# Patient Record
Sex: Female | Born: 1980 | Hispanic: No | Marital: Married | Smoking: Never smoker
Health system: Southern US, Community
[De-identification: ages and names within clinical notes are randomized; demographics above are authoritative.]

## PROBLEM LIST (undated history)

## (undated) DIAGNOSIS — T7840XA Allergy, unspecified, initial encounter: Secondary | ICD-10-CM

## (undated) DIAGNOSIS — K900 Celiac disease: Secondary | ICD-10-CM

## (undated) DIAGNOSIS — F32A Depression, unspecified: Secondary | ICD-10-CM

## (undated) DIAGNOSIS — E039 Hypothyroidism, unspecified: Secondary | ICD-10-CM

## (undated) DIAGNOSIS — N921 Excessive and frequent menstruation with irregular cycle: Secondary | ICD-10-CM

## (undated) DIAGNOSIS — D509 Iron deficiency anemia, unspecified: Secondary | ICD-10-CM

## (undated) DIAGNOSIS — K909 Intestinal malabsorption, unspecified: Secondary | ICD-10-CM

## (undated) HISTORY — DX: Intestinal malabsorption, unspecified: K90.9

## (undated) HISTORY — DX: Iron deficiency anemia, unspecified: D50.9

## (undated) HISTORY — DX: Excessive and frequent menstruation with irregular cycle: N92.1

## (undated) HISTORY — DX: Celiac disease: K90.0

## (undated) HISTORY — PX: TONSILLECTOMY: SUR1361

---

## 2009-11-05 ENCOUNTER — Inpatient Hospital Stay (HOSPITAL_COMMUNITY): Admission: AD | Admit: 2009-11-05 | Discharge: 2009-11-08 | Payer: Self-pay | Admitting: Internal Medicine

## 2010-09-01 ENCOUNTER — Encounter
Admission: RE | Admit: 2010-09-01 | Discharge: 2010-09-01 | Payer: Self-pay | Source: Home / Self Care | Attending: Gastroenterology | Admitting: Gastroenterology

## 2010-09-11 ENCOUNTER — Inpatient Hospital Stay (HOSPITAL_COMMUNITY): Payer: 59

## 2010-09-11 ENCOUNTER — Inpatient Hospital Stay (HOSPITAL_COMMUNITY)
Admission: AD | Admit: 2010-09-11 | Discharge: 2010-09-11 | Disposition: A | Discharge status: Home / Self Care | Payer: 59 | Source: Ambulatory Visit | Attending: Obstetrics and Gynecology | Admitting: Obstetrics and Gynecology

## 2010-09-11 ENCOUNTER — Encounter (HOSPITAL_COMMUNITY): Payer: Self-pay

## 2010-09-11 DIAGNOSIS — O00109 Unspecified tubal pregnancy without intrauterine pregnancy: Secondary | ICD-10-CM | POA: Insufficient documentation

## 2010-09-11 DIAGNOSIS — O99891 Other specified diseases and conditions complicating pregnancy: Secondary | ICD-10-CM

## 2010-09-11 DIAGNOSIS — O9989 Other specified diseases and conditions complicating pregnancy, childbirth and the puerperium: Secondary | ICD-10-CM

## 2010-09-11 LAB — URINALYSIS, ROUTINE W REFLEX MICROSCOPIC
Bilirubin Urine: NEGATIVE
Hgb urine dipstick: NEGATIVE
Nitrite: NEGATIVE
Specific Gravity, Urine: 1.025 (ref 1.005–1.030)
Urobilinogen, UA: 0.2 mg/dL (ref 0.0–1.0)
pH: 6 (ref 5.0–8.0)

## 2010-10-21 LAB — PLATELET COUNT: Platelets: 96 10*3/uL — ABNORMAL LOW (ref 150–400)

## 2010-10-21 LAB — CBC
HCT: 30.7 % — ABNORMAL LOW (ref 36.0–46.0)
Hemoglobin: 10.4 g/dL — ABNORMAL LOW (ref 12.0–15.0)
Hemoglobin: 11.3 g/dL — ABNORMAL LOW (ref 12.0–15.0)
RBC: 3.37 MIL/uL — ABNORMAL LOW (ref 3.87–5.11)
RBC: 3.74 MIL/uL — ABNORMAL LOW (ref 3.87–5.11)
WBC: 8.9 10*3/uL (ref 4.0–10.5)

## 2010-10-21 LAB — RPR: RPR Ser Ql: NONREACTIVE

## 2014-06-03 ENCOUNTER — Encounter (HOSPITAL_COMMUNITY): Payer: Self-pay

## 2015-01-22 ENCOUNTER — Other Ambulatory Visit (HOSPITAL_BASED_OUTPATIENT_CLINIC_OR_DEPARTMENT_OTHER): Payer: 59

## 2015-01-22 ENCOUNTER — Encounter: Payer: Self-pay | Admitting: Hematology & Oncology

## 2015-01-22 ENCOUNTER — Ambulatory Visit: Payer: 59

## 2015-01-22 ENCOUNTER — Ambulatory Visit (HOSPITAL_BASED_OUTPATIENT_CLINIC_OR_DEPARTMENT_OTHER): Payer: 59 | Admitting: Hematology & Oncology

## 2015-01-22 VITALS — BP 109/67 | HR 57 | Temp 97.5°F | Resp 14 | Ht 66.0 in | Wt 160.0 lb

## 2015-01-22 DIAGNOSIS — D510 Vitamin B12 deficiency anemia due to intrinsic factor deficiency: Secondary | ICD-10-CM

## 2015-01-22 DIAGNOSIS — D696 Thrombocytopenia, unspecified: Secondary | ICD-10-CM

## 2015-01-22 DIAGNOSIS — E039 Hypothyroidism, unspecified: Secondary | ICD-10-CM

## 2015-01-22 DIAGNOSIS — D500 Iron deficiency anemia secondary to blood loss (chronic): Secondary | ICD-10-CM

## 2015-01-22 DIAGNOSIS — K900 Celiac disease: Secondary | ICD-10-CM | POA: Diagnosis not present

## 2015-01-22 DIAGNOSIS — R5383 Other fatigue: Secondary | ICD-10-CM | POA: Diagnosis not present

## 2015-01-22 LAB — CBC WITH DIFFERENTIAL (CANCER CENTER ONLY)
BASO#: 0 10*3/uL (ref 0.0–0.2)
BASO%: 0.4 % (ref 0.0–2.0)
EOS ABS: 0.3 10*3/uL (ref 0.0–0.5)
EOS%: 5.4 % (ref 0.0–7.0)
HCT: 35.6 % (ref 34.8–46.6)
HEMOGLOBIN: 11.8 g/dL (ref 11.6–15.9)
LYMPH#: 1.2 10*3/uL (ref 0.9–3.3)
LYMPH%: 22.2 % (ref 14.0–48.0)
MCH: 27.6 pg (ref 26.0–34.0)
MCHC: 33.1 g/dL (ref 32.0–36.0)
MCV: 83 fL (ref 81–101)
MONO#: 0.5 10*3/uL (ref 0.1–0.9)
MONO%: 8.1 % (ref 0.0–13.0)
NEUT%: 63.9 % (ref 39.6–80.0)
NEUTROS ABS: 3.5 10*3/uL (ref 1.5–6.5)
Platelets: 165 10*3/uL (ref 145–400)
RBC: 4.28 10*6/uL (ref 3.70–5.32)
RDW: 13.3 % (ref 11.1–15.7)
WBC: 5.5 10*3/uL (ref 3.9–10.0)

## 2015-01-22 LAB — VITAMIN B12: VITAMIN B 12: 605 pg/mL (ref 211–911)

## 2015-01-22 LAB — RETICULOCYTES (CHCC)
ABS Retic: 56.9 10*3/uL (ref 19.0–186.0)
RBC.: 4.38 MIL/uL (ref 3.87–5.11)
Retic Ct Pct: 1.3 % (ref 0.4–2.3)

## 2015-01-22 LAB — CMP (CANCER CENTER ONLY)
ALBUMIN: 3.4 g/dL (ref 3.3–5.5)
ALT: 18 U/L (ref 10–47)
AST: 23 U/L (ref 11–38)
Alkaline Phosphatase: 65 U/L (ref 26–84)
BILIRUBIN TOTAL: 0.7 mg/dL (ref 0.20–1.60)
BUN: 8 mg/dL (ref 7–22)
CALCIUM: 8.7 mg/dL (ref 8.0–10.3)
CO2: 28 mEq/L (ref 18–33)
CREATININE: 0.8 mg/dL (ref 0.6–1.2)
Chloride: 101 mEq/L (ref 98–108)
Glucose, Bld: 98 mg/dL (ref 73–118)
Potassium: 3.8 mEq/L (ref 3.3–4.7)
Sodium: 138 mEq/L (ref 128–145)
Total Protein: 6.9 g/dL (ref 6.4–8.1)

## 2015-01-22 LAB — TECHNOLOGIST REVIEW CHCC SATELLITE

## 2015-01-22 LAB — CHCC SATELLITE - SMEAR

## 2015-01-22 NOTE — Progress Notes (Signed)
Referral MD  Reason for Referral: Thrombocytopenia and fatigue.   Chief Complaint  Patient presents with  . NEW PATIENT  : I'm so tired. I platelets are low.  HPI: Regina Mayo is a very nice 34 year old [name]. She has 2 children. She has a very interesting history in which she has celiac disease. She was diagnosed with this back in 2009.  She has been having more problems lately. 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 She just is felt incredibly tired. She's had a lot of fatigue. She is seeing her family doctor. Lab work was done. She was found have some thrombocytopenia. Go back to the labs, her blood count has been on the low side for a few years.  The patient tells me that she has at iron in the past. She is taking oral B-12. I find this interesting since I would think that this would not be effective with celiac disease.  She's had no bleeding outside of her monthly cycles which might be a little bit heavier.  She's had no surgery. There is no history of blood problems in the family. There is no history of cancer in the family.  She's had no weight loss or weight gain. She's had no fever. She's had no rashes. She's had no mouth sores. She is not chewing ice.  She is coming referred to the [facility] for an evaluation.  Overall, her performance status is ECOG 1.   No past medical history on file.:  No past surgical history on file.:   Current outpatient prescriptions:  .  citalopram (CELEXA) 20 MG tablet, Take 20 mg by mouth daily. , Disp: , Rfl:  .  Fe Fum-FePoly-Vit C-Vit B3 (INTEGRA) 62.5-62.5-40-3 MG CAPS, Take by mouth every morning. , Disp: , Rfl:  .  levothyroxine (SYNTHROID, LEVOTHROID) 88 MCG tablet, 88 mcg daily before breakfast. , Disp: , Rfl:  .  meloxicam (MOBIC) 15 MG tablet, Take 15 mg by mouth as needed. , Disp: , Rfl:  .  Vitamin D, Ergocalciferol, (DRISDOL) 50000 UNITS CAPS capsule, Take 50,000 Units by mouth every 30 (thirty) days. ,  Disp: , Rfl: :  :  No Known Allergies:  No family history on file.:  History   Social History  . Marital Status: Married    Spouse Name: N/A  . Number of Children: N/A  . Years of Education: N/A   Occupational History  . Not on file.   Social History Main Topics  . Smoking status: Never Smoker   . Smokeless tobacco: Never Used     Comment: NEVER USED TOBACCO  . Alcohol Use: Not on file  . Drug Use: Not on file  . Sexual Activity: Not on file   Other Topics Concern  . Not on file   Social History Narrative  :  Pertinent items are noted in HPI.  Exam: @IPVITALS @  well-developed and well-nourished [name] in no obvious distress. Her vital signs show a temperature of 97.5. Pulse 57. Blood pressure 109/67. Weight is 160 pounds. Head and neck exam shows no ocular or oral lesions. She has no scleral icterus. She has no glossitis. There is no adenopathy in her neck. Lungs are clear bilaterally. Cardiac exam regular rate and rhythm with no murmurs, rubs or bruits. Abdomen is soft. She has good bowel sounds. There is no fluid wave. There is no guarding or rebound tenderness. There is no palpable liver or spleen tip. Back exam shows no tenderness over the spine, ribs  or hips. Extremities shows no clubbing, cyanosis or edema. Neurological exam shows no obvious neurological deficits.    Recent Labs  01/22/15 1411  WBC 5.5  HGB 11.8  HCT 35.6  PLT 165    Recent Labs  01/22/15 1412  NA 138  K 3.8  CL 101  CO2 28  GLUCOSE 98  BUN 8  CREATININE 0.8  CALCIUM 8.7    Blood smear review: Slight anisocytosis. She has some microcytic red blood cells. I see no target cells. There is no inclusion bodies. There is no schistocytes or spherocytes. She has no rouleau formation. There are no nucleated red cells. White cells been normal in morphology maturation. I see no hypersegmented polys. I see no atypical lymphocytes. There is no immature myeloid cells. Platelets are normal  in size. Platelets are well granulated.  Pathology: None     Assessment and Plan: She is a 34 year old [name]. She has transient thrombocytopenia. I don't think this is at all related to her fatigue.  I would have to suspect that she has iron deficiency by her blood smear. I think also the fact that her platelet count is now normal would indicate that her iron might be on the low side. Again, her blood smear would be suggestive of iron deficiency.  I don't see any other hematologic issue. I don't see any indication for a bone marrow biopsy. I don't see any indication for x-ray studies.  I spent about 45 minutes with her. I showed her the lab work. I'll try to explain to her that I thought that her iron might be on the low side. If it is low, then she will definitely need IV iron as she has celiac disease and will not Beil to absorb any oral iron. Not sure why she is even on oral iron.  If her iron stores are okay, I'm not sure as to why she would be fatigued. I don't find anything on her exam that would suggest a malignancy.  We will give her a call and let her know the results of our lab work and then figure out when we need to get her back.

## 2015-01-23 LAB — IRON AND TIBC CHCC
%SAT: 14 % — AB (ref 21–57)
IRON: 52 ug/dL (ref 41–142)
TIBC: 371 ug/dL (ref 236–444)
UIBC: 319 ug/dL (ref 120–384)

## 2015-01-23 LAB — FERRITIN CHCC: FERRITIN: 7 ng/mL — AB (ref 9–269)

## 2015-01-23 LAB — ANTI-PARIETAL ANTIBODY: Parietal Cell Antibody-IgG: POSITIVE — AB

## 2015-01-25 LAB — INTRINSIC FACTOR ANTIBODIES: INTRINSIC FACTOR: NEGATIVE

## 2015-01-28 ENCOUNTER — Telehealth: Payer: Self-pay | Admitting: *Deleted

## 2015-01-28 NOTE — Telephone Encounter (Addendum)
Message left on personal voice mail. Message sent to scheduler.   ----- Message from Volanda Napoleon, MD sent at 01/26/2015  9:21 PM EDT ----- Call - iron levels are very low!!!  Need 2 doses of feraheme!!  Please set up!!  Make sure I set an appt up with her in 5 weeks!!! Laurey Arrow

## 2015-02-06 ENCOUNTER — Other Ambulatory Visit: Payer: Self-pay | Admitting: Family

## 2015-02-06 ENCOUNTER — Ambulatory Visit (HOSPITAL_BASED_OUTPATIENT_CLINIC_OR_DEPARTMENT_OTHER): Payer: 59

## 2015-02-06 DIAGNOSIS — D509 Iron deficiency anemia, unspecified: Secondary | ICD-10-CM

## 2015-02-06 DIAGNOSIS — E611 Iron deficiency: Secondary | ICD-10-CM

## 2015-02-06 MED ORDER — SODIUM CHLORIDE 0.9 % IV SOLN
510.0000 mg | Freq: Once | INTRAVENOUS | Status: DC
  Administered 2015-02-06: 510 mg via INTRAVENOUS
  Filled 2015-02-06: qty 17

## 2015-02-06 NOTE — Patient Instructions (Signed)

## 2015-02-13 ENCOUNTER — Ambulatory Visit (HOSPITAL_BASED_OUTPATIENT_CLINIC_OR_DEPARTMENT_OTHER): Payer: 59

## 2015-02-13 VITALS — BP 100/54 | HR 71 | Temp 97.8°F | Resp 20

## 2015-02-13 DIAGNOSIS — D509 Iron deficiency anemia, unspecified: Secondary | ICD-10-CM

## 2015-02-13 MED ORDER — SODIUM CHLORIDE 0.9 % IV SOLN
INTRAVENOUS | Status: DC
  Administered 2015-02-13: 14:00:00 via INTRAVENOUS

## 2015-02-13 MED ORDER — SODIUM CHLORIDE 0.9 % IV SOLN
510.0000 mg | Freq: Once | INTRAVENOUS | Status: AC
  Administered 2015-02-13: 510 mg via INTRAVENOUS
  Filled 2015-02-13: qty 17

## 2015-02-13 NOTE — Patient Instructions (Signed)

## 2015-03-03 ENCOUNTER — Other Ambulatory Visit: Payer: Self-pay | Admitting: *Deleted

## 2015-03-03 DIAGNOSIS — D509 Iron deficiency anemia, unspecified: Secondary | ICD-10-CM

## 2015-03-04 ENCOUNTER — Encounter: Payer: Self-pay | Admitting: Hematology & Oncology

## 2015-03-04 ENCOUNTER — Ambulatory Visit (HOSPITAL_BASED_OUTPATIENT_CLINIC_OR_DEPARTMENT_OTHER): Payer: 59 | Admitting: Family

## 2015-03-04 ENCOUNTER — Other Ambulatory Visit (HOSPITAL_BASED_OUTPATIENT_CLINIC_OR_DEPARTMENT_OTHER): Payer: 59

## 2015-03-04 VITALS — BP 102/62 | HR 69 | Temp 98.0°F | Resp 18 | Ht 66.0 in | Wt 163.0 lb

## 2015-03-04 DIAGNOSIS — D509 Iron deficiency anemia, unspecified: Secondary | ICD-10-CM

## 2015-03-04 DIAGNOSIS — D696 Thrombocytopenia, unspecified: Secondary | ICD-10-CM | POA: Diagnosis not present

## 2015-03-04 LAB — IRON AND TIBC CHCC
%SAT: 52 % (ref 21–57)
Iron: 128 ug/dL (ref 41–142)
TIBC: 249 ug/dL (ref 236–444)
UIBC: 120 ug/dL (ref 120–384)

## 2015-03-04 LAB — CBC WITH DIFFERENTIAL (CANCER CENTER ONLY)
BASO#: 0 10*3/uL (ref 0.0–0.2)
BASO%: 0.7 % (ref 0.0–2.0)
EOS%: 3.5 % (ref 0.0–7.0)
Eosinophils Absolute: 0.2 10*3/uL (ref 0.0–0.5)
HCT: 38.2 % (ref 34.8–46.6)
HGB: 12.7 g/dL (ref 11.6–15.9)
LYMPH#: 0.8 10*3/uL — ABNORMAL LOW (ref 0.9–3.3)
LYMPH%: 18.4 % (ref 14.0–48.0)
MCH: 28.4 pg (ref 26.0–34.0)
MCHC: 33.2 g/dL (ref 32.0–36.0)
MCV: 86 fL (ref 81–101)
MONO#: 0.4 10*3/uL (ref 0.1–0.9)
MONO%: 9.2 % (ref 0.0–13.0)
NEUT%: 68.2 % (ref 39.6–80.0)
NEUTROS ABS: 2.9 10*3/uL (ref 1.5–6.5)
PLATELETS: 118 10*3/uL — AB (ref 145–400)
RBC: 4.47 10*6/uL (ref 3.70–5.32)
RDW: 14.9 % (ref 11.1–15.7)
WBC: 4.3 10*3/uL (ref 3.9–10.0)

## 2015-03-04 LAB — FERRITIN CHCC: FERRITIN: 240 ng/mL (ref 9–269)

## 2015-03-04 NOTE — Progress Notes (Signed)
Hematology and Oncology Follow Up Visit  Regina Mayo [id number] 1981/05/19 34 y.o. 03/04/2015   Principle Diagnosis:  Iron deficiency anemia   Current Therapy:   IV iron as indicated     Interim History:  Regina Mayo is here today for a follow-up. She received 2 doses of Feraheme in July and has responded quite nicely. Her symptoms are much improved. Her SOB and fatigue are much better.  She described her last cycle as "more normal" and not as heavy as usual.  She has had no fever, chills, n/v, cough, rash, dizziness, headaches, oral sores, chest pain, palpitations, abdominal pain, no changes in bowel or bladder habits. No blood in her urine or stool.  She has had no bruising, petechiae or episodes of bleeding.  No swelling, tenderess, numbness or tingling in her extremities. No c/o pain.  She has a good appetite and is eating well. She stays on a strict diet due to celiac disease.   Medications:    Medication List       This list is accurate as of: 03/04/15 12:26 PM.  Always use your most recent med list.               INTEGRA 62.5-62.5-40-3 MG Caps  Take by mouth every morning.     levothyroxine 88 MCG tablet  Commonly known as:  SYNTHROID, LEVOTHROID  88 mcg daily before breakfast.     PRISTIQ PO  Take 1 tablet by mouth daily.     Vitamin D (Ergocalciferol) 50000 UNITS Caps capsule  Commonly known as:  DRISDOL  Take 50,000 Units by mouth every 30 (thirty) days.        Allergies: No Known Allergies  Past Medical History, Surgical history, Social history, and Family History were reviewed and updated.  Review of Systems: All other 10 point review of systems is negative.   Physical Exam:  height is 5' 6"  (1.676 m) and weight is 163 lb (73.936 kg). Her oral temperature is 98 F (36.7 C). Her blood pressure is 102/62 and her pulse is 69. Her respiration is 18.   Wt Readings from Last 3 Encounters:  03/04/15 163 lb (73.936 kg)  01/22/15 160 lb (72.576  kg)    Ocular: Sclerae unicteric, pupils equal, round and reactive to light Ear-nose-throat: Oropharynx clear, dentition fair Lymphatic: No cervical or supraclavicular adenopathy Lungs no rales or rhonchi, good excursion bilaterally Heart regular rate and rhythm, no murmur appreciated Abd soft, nontender, positive bowel sounds MSK no focal spinal tenderness, no joint edema Neuro: non-focal, well-oriented, appropriate affect Breasts: Deferred  Lab Results  Component Value Date   WBC 4.3 03/04/2015   HGB 12.7 03/04/2015   HCT 38.2 03/04/2015   MCV 86 03/04/2015   PLT 118* 03/04/2015   Lab Results  Component Value Date   FERRITIN 7* 01/22/2015   IRON 52 01/22/2015   TIBC 371 01/22/2015   UIBC 319 01/22/2015   IRONPCTSAT 14* 01/22/2015   Lab Results  Component Value Date   RETICCTPCT 1.3 01/22/2015   RBC 4.47 03/04/2015   RETICCTABS 56.9 01/22/2015   No results found for: KPAFRELGTCHN, LAMBDASER, KAPLAMBRATIO No results found for: IGGSERUM, IGA, IGMSERUM No results found for: Ronnald Ramp, A1GS, A2GS, Tillman Sers, SPEI   Chemistry      Component Value Date/Time   NA 138 01/22/2015 1412   K 3.8 01/22/2015 1412   CL 101 01/22/2015 1412   CO2 28 01/22/2015 1412   BUN 8 01/22/2015 1412  CREATININE 0.8 01/22/2015 1412      Component Value Date/Time   CALCIUM 8.7 01/22/2015 1412   ALKPHOS 65 01/22/2015 1412   AST 23 01/22/2015 1412   ALT 18 01/22/2015 1412   BILITOT 0.70 01/22/2015 1412     Impression and Plan: She is a 34 year old [name] with iron deficiency anemia and thrombocytopenia. She has received 2 doses of Feraheme and is beginning to feel much better.  Her Hgb and MCV are both improved. We will see what her iron studies show. We can bring her in later this week for another dose of iron.  We will plan to see her back in 3 months for labs and follow-up.  She knows to contact us with any questions or concerns. We can  certainly see her sooner if need be.   Eliezer Bottom, NP 8/2/201612:26 PM

## 2015-03-06 ENCOUNTER — Telehealth: Payer: Self-pay | Admitting: *Deleted

## 2015-03-06 NOTE — Telephone Encounter (Signed)
-----   Message from Volanda Napoleon, MD sent at 03/05/2015  2:23 PM EDT ----- Call - iron level is much better!!!  pete

## 2015-06-04 ENCOUNTER — Ambulatory Visit: Payer: 59 | Admitting: Hematology & Oncology

## 2015-06-04 ENCOUNTER — Other Ambulatory Visit: Payer: 59

## 2015-06-04 ENCOUNTER — Ambulatory Visit: Payer: 59

## 2015-07-17 ENCOUNTER — Ambulatory Visit: Payer: 59 | Admitting: Hematology & Oncology

## 2015-07-17 ENCOUNTER — Other Ambulatory Visit: Payer: 59

## 2015-07-17 ENCOUNTER — Ambulatory Visit: Payer: 59

## 2015-08-08 ENCOUNTER — Encounter: Payer: Self-pay | Admitting: Hematology & Oncology

## 2015-08-08 ENCOUNTER — Ambulatory Visit (HOSPITAL_BASED_OUTPATIENT_CLINIC_OR_DEPARTMENT_OTHER): Payer: BLUE CROSS/BLUE SHIELD

## 2015-08-08 ENCOUNTER — Other Ambulatory Visit (HOSPITAL_BASED_OUTPATIENT_CLINIC_OR_DEPARTMENT_OTHER): Payer: BLUE CROSS/BLUE SHIELD

## 2015-08-08 ENCOUNTER — Ambulatory Visit (HOSPITAL_BASED_OUTPATIENT_CLINIC_OR_DEPARTMENT_OTHER): Payer: BLUE CROSS/BLUE SHIELD | Admitting: Hematology & Oncology

## 2015-08-08 VITALS — BP 114/72 | HR 82 | Temp 98.2°F | Resp 16 | Ht 66.0 in | Wt 164.0 lb

## 2015-08-08 DIAGNOSIS — K900 Celiac disease: Secondary | ICD-10-CM

## 2015-08-08 DIAGNOSIS — K909 Intestinal malabsorption, unspecified: Secondary | ICD-10-CM

## 2015-08-08 DIAGNOSIS — D509 Iron deficiency anemia, unspecified: Secondary | ICD-10-CM | POA: Diagnosis not present

## 2015-08-08 DIAGNOSIS — E559 Vitamin D deficiency, unspecified: Secondary | ICD-10-CM

## 2015-08-08 DIAGNOSIS — N921 Excessive and frequent menstruation with irregular cycle: Secondary | ICD-10-CM | POA: Insufficient documentation

## 2015-08-08 HISTORY — DX: Iron deficiency anemia, unspecified: D50.9

## 2015-08-08 HISTORY — DX: Excessive and frequent menstruation with irregular cycle: N92.1

## 2015-08-08 HISTORY — DX: Intestinal malabsorption, unspecified: K90.9

## 2015-08-08 HISTORY — DX: Celiac disease: K90.0

## 2015-08-08 LAB — CBC WITH DIFFERENTIAL (CANCER CENTER ONLY)
BASO#: 0.1 10*3/uL (ref 0.0–0.2)
BASO%: 1 % (ref 0.0–2.0)
EOS%: 3.9 % (ref 0.0–7.0)
Eosinophils Absolute: 0.2 10*3/uL (ref 0.0–0.5)
HEMATOCRIT: 39.4 % (ref 34.8–46.6)
HGB: 12.5 g/dL (ref 11.6–15.9)
LYMPH#: 1 10*3/uL (ref 0.9–3.3)
LYMPH%: 19.4 % (ref 14.0–48.0)
MCH: 27.7 pg (ref 26.0–34.0)
MCHC: 31.7 g/dL — ABNORMAL LOW (ref 32.0–36.0)
MCV: 87 fL (ref 81–101)
MONO#: 0.5 10*3/uL (ref 0.1–0.9)
MONO%: 8.8 % (ref 0.0–13.0)
NEUT#: 3.4 10*3/uL (ref 1.5–6.5)
NEUT%: 66.9 % (ref 39.6–80.0)
Platelets: 171 10*3/uL (ref 145–400)
RBC: 4.52 10*6/uL (ref 3.70–5.32)
RDW: 12.8 % (ref 11.1–15.7)
WBC: 5.1 10*3/uL (ref 3.9–10.0)

## 2015-08-08 LAB — COMPREHENSIVE METABOLIC PANEL
ALT: 15 U/L (ref 0–55)
AST: 21 U/L (ref 5–34)
Albumin: 3.9 g/dL (ref 3.5–5.0)
Alkaline Phosphatase: 78 U/L (ref 40–150)
Anion Gap: 8 mEq/L (ref 3–11)
BUN: 17.2 mg/dL (ref 7.0–26.0)
CHLORIDE: 104 meq/L (ref 98–109)
CO2: 26 mEq/L (ref 22–29)
Calcium: 9.1 mg/dL (ref 8.4–10.4)
Creatinine: 0.8 mg/dL (ref 0.6–1.1)
EGFR: >90 mL/min/{1.73_m2} (ref >90)
Glucose: 99 mg/dl (ref 70–140)
POTASSIUM: 4.1 meq/L (ref 3.5–5.1)
Sodium: 138 mEq/L (ref 136–145)
Total Bilirubin: <0.3 mg/dL (ref 0.20–1.20)
Total Protein: 7.6 g/dL (ref 6.4–8.3)

## 2015-08-08 LAB — RETICULOCYTES
ABS Retic: 58.1 10*3/uL (ref 19.0–186.0)
RBC.: 4.47 MIL/uL (ref 3.87–5.11)
Retic Ct Pct: 1.3 % (ref 0.4–2.3)

## 2015-08-08 MED ORDER — SODIUM CHLORIDE 0.9 % IV SOLN
Freq: Once | INTRAVENOUS | Status: AC
  Administered 2015-08-08: 14:00:00 via INTRAVENOUS

## 2015-08-08 MED ORDER — SODIUM CHLORIDE 0.9 % IV SOLN
510.0000 mg | Freq: Once | INTRAVENOUS | Status: AC
  Administered 2015-08-08: 510 mg via INTRAVENOUS
  Filled 2015-08-08: qty 17

## 2015-08-08 NOTE — Progress Notes (Signed)
Hematology and Oncology Follow Up Visit  Regina Mayo [id number] 04/13/1981 35 y.o. 08/08/2015   Principle Diagnosis:  Iron deficiency anemia   Current Therapy:   IV iron as indicated - dose given 08/08/2015    Interim History:  Regina Mayo is here today for a follow-up. She last received iron back in July. This made her feel a lot better. She feels tired now. She feels more fatigued. She still having her monthly cycles. She loses iron with her monthly cycles.  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 She does have some celiac disease.   She's had some soreness in her mouth. She's had some slight inflammation of her tongue.  She's not chewing ice.  She's had no cough.  She does have some arthralgias. I suspect she may also have some vitamin D deficiency.  She's had no fever.  Outside of the month is cycles, she's had no bleeding.  She and her family might be going back to [place] for vacation this summer.    Medications:    Medication List       This list is accurate as of: 08/08/15  1:26 PM.  Always use your most recent med list.               levothyroxine 88 MCG tablet  Commonly known as:  SYNTHROID, LEVOTHROID  88 mcg daily before breakfast.     PRISTIQ PO  Take 1 tablet by mouth daily.     PRISTIQ 50 MG 24 hr tablet  Generic drug:  desvenlafaxine  Take 50 tablets by mouth daily.     Vitamin D (Ergocalciferol) 50000 units Caps capsule  Commonly known as:  DRISDOL  Take 50,000 Units by mouth every 30 (thirty) days.        Allergies: No Known Allergies  Past Medical History, Surgical history, Social history, and Family History were reviewed and updated.  Review of Systems: All other 10 point review of systems is negative.   Physical Exam:  height is 5' 6"  (1.676 m) and weight is 164 lb (74.39 kg). Her oral temperature is 98.2 F (36.8 C). Her blood pressure is 114/72 and her pulse is 82. Her respiration is 16.   Wt Readings from Last 3  Encounters:  08/08/15 164 lb (74.39 kg)  03/04/15 163 lb (73.936 kg)  01/22/15 160 lb (72.576 kg)    Head exam shows no ocular or oral lesions. She has no scleral icterus. There is no conjunctival pallor. She's had no palpable lymph glands. She does have some slight tongue inflammation. Lungs are clear. Cardiac exam regular rate and rhythm with no murmurs, rubs or bruits. Abdomen is soft and she has good bowel sounds. There is no fluid wave. There is no palpable liver or spleen tip. Back exam shows no tenderness over the spine, ribs or hips. Extrema shows no clubbing, cyanosis or edema. Neurological exam shows no focal neurological deficits.  Lab Results  Component Value Date   WBC 5.1 08/08/2015   HGB 12.5 08/08/2015   HCT 39.4 08/08/2015   MCV 87 08/08/2015   PLT 171 08/08/2015   Lab Results  Component Value Date   FERRITIN 240 03/04/2015   IRON 128 03/04/2015   TIBC 249 03/04/2015   UIBC 120 03/04/2015   IRONPCTSAT 52 03/04/2015   Lab Results  Component Value Date   RETICCTPCT 1.3 01/22/2015   RBC 4.52 08/08/2015   RETICCTABS 56.9 01/22/2015   No results found for: KPAFRELGTCHN, LAMBDASER, KAPLAMBRATIO No results found for:  IGGSERUM, IGA, IGMSERUM No results found for: Regina Mayo, SPEI   Chemistry      Component Value Date/Time   NA 138 01/22/2015 1412   K 3.8 01/22/2015 1412   CL 101 01/22/2015 1412   CO2 28 01/22/2015 1412   BUN 8 01/22/2015 1412   CREATININE 0.8 01/22/2015 1412      Component Value Date/Time   CALCIUM 8.7 01/22/2015 1412   ALKPHOS 65 01/22/2015 1412   AST 23 01/22/2015 1412   ALT 18 01/22/2015 1412   BILITOT 0.70 01/22/2015 1412     Impression and Plan: She is a 35 year old [name] with iron deficiency anemia. Of note, she truly has a slightly low platelet count. Her blood count is normal. This might be a sign of iron deficiency.  I looked at her blood smear under the microscope.  She does have some hyperchromic and microcytic red cells. There is no nucleated red cells. She has no immature myeloid or lymphoid cells.She has no hypersegmented polys.  We will go ahead and give her dose of iron today.  We will implant to get her back in another 2-3 months.  Regina Napoleon, MD 1/6/20171:26 PM

## 2015-08-08 NOTE — Patient Instructions (Signed)

## 2015-08-09 LAB — VITAMIN D 25 HYDROXY (VIT D DEFICIENCY, FRACTURES): Vit D, 25-Hydroxy: 26 ng/mL — ABNORMAL LOW (ref 30–100)

## 2015-08-11 ENCOUNTER — Telehealth: Payer: Self-pay

## 2015-08-11 LAB — FERRITIN: Ferritin: 16 ng/ml (ref 9–269)

## 2015-08-11 LAB — IRON AND TIBC
%SAT: 19 % — ABNORMAL LOW (ref 21–57)
Iron: 66 ug/dL (ref 41–142)
TIBC: 343 ug/dL (ref 236–444)
UIBC: 276 ug/dL (ref 120–384)

## 2015-08-11 NOTE — Telephone Encounter (Addendum)
-----   Message from Volanda Napoleon, MD sent at 08/11/2015  8:40 AM EST ----- Call - Vit D level is low!!  Please take 2000 units a day!!! Make sure that her family MD gets this result!!  Laurey Arrow     Patient called and made aware of her vitamin D being low and she need to take 2000 units a day over the counter.  Patient verbalized understanding. Roselyn Reef will be sending result to PCP.

## 2015-08-27 ENCOUNTER — Telehealth: Payer: Self-pay | Admitting: *Deleted

## 2015-08-27 NOTE — Telephone Encounter (Signed)
Patient c/o continued fatigued. Her last labs showed a slightly decreased iron saturation which she received one dose of feraheme for. She is taking her vitamin D supplement, however she does admit to forgetting a few doses. She states in the past she has taken vitamin b12 for fatigue but has not done so in recent years. Her last vitamin b12 level was normal, 01/2015.  Reviewed with Dr Marin Olp and he would like patient to follow up with her PCP for further evaluation. Patient understood.

## 2015-10-06 ENCOUNTER — Other Ambulatory Visit (HOSPITAL_BASED_OUTPATIENT_CLINIC_OR_DEPARTMENT_OTHER): Payer: BLUE CROSS/BLUE SHIELD

## 2015-10-06 ENCOUNTER — Ambulatory Visit (HOSPITAL_BASED_OUTPATIENT_CLINIC_OR_DEPARTMENT_OTHER): Payer: BLUE CROSS/BLUE SHIELD | Admitting: Family

## 2015-10-06 ENCOUNTER — Encounter: Payer: Self-pay | Admitting: Family

## 2015-10-06 VITALS — BP 113/64 | HR 67 | Temp 98.1°F | Resp 16 | Ht 66.0 in | Wt 166.0 lb

## 2015-10-06 DIAGNOSIS — D509 Iron deficiency anemia, unspecified: Secondary | ICD-10-CM | POA: Diagnosis not present

## 2015-10-06 DIAGNOSIS — K900 Celiac disease: Secondary | ICD-10-CM

## 2015-10-06 DIAGNOSIS — D696 Thrombocytopenia, unspecified: Secondary | ICD-10-CM | POA: Diagnosis not present

## 2015-10-06 DIAGNOSIS — R5383 Other fatigue: Secondary | ICD-10-CM | POA: Diagnosis not present

## 2015-10-06 DIAGNOSIS — N921 Excessive and frequent menstruation with irregular cycle: Secondary | ICD-10-CM

## 2015-10-06 DIAGNOSIS — K909 Intestinal malabsorption, unspecified: Secondary | ICD-10-CM

## 2015-10-06 DIAGNOSIS — E559 Vitamin D deficiency, unspecified: Secondary | ICD-10-CM

## 2015-10-06 LAB — CBC WITH DIFFERENTIAL (CANCER CENTER ONLY)
BASO#: 0 10*3/uL (ref 0.0–0.2)
BASO%: 0.8 % (ref 0.0–2.0)
EOS%: 3.1 % (ref 0.0–7.0)
Eosinophils Absolute: 0.2 10*3/uL (ref 0.0–0.5)
HEMATOCRIT: 38.4 % (ref 34.8–46.6)
HEMOGLOBIN: 12.6 g/dL (ref 11.6–15.9)
LYMPH#: 0.9 10*3/uL (ref 0.9–3.3)
LYMPH%: 19.1 % (ref 14.0–48.0)
MCH: 28.4 pg (ref 26.0–34.0)
MCHC: 32.8 g/dL (ref 32.0–36.0)
MCV: 87 fL (ref 81–101)
MONO#: 0.4 10*3/uL (ref 0.1–0.9)
MONO%: 7.5 % (ref 0.0–13.0)
NEUT%: 69.5 % (ref 39.6–80.0)
NEUTROS ABS: 3.3 10*3/uL (ref 1.5–6.5)
PLATELETS: 187 10*3/uL (ref 145–400)
RBC: 4.44 10*6/uL (ref 3.70–5.32)
RDW: 13.1 % (ref 11.1–15.7)
WBC: 4.8 10*3/uL (ref 3.9–10.0)

## 2015-10-06 NOTE — Progress Notes (Signed)
Hematology and Oncology Follow Up Visit  Regina Mayo [id number] 1981/06/29 35 y.o. 10/06/2015   Principle Diagnosis:  Iron deficiency anemia   Current Therapy:   IV iron as indicated     Interim History:  Regina Mayo is here today for a follow-up. She is doing well and feeling much better since having received Feraheme in January. Her iron saturation at that time was 19% with a ferritin of 16. She has some fatigue at times but this has improved.  Her cycles have been lighter and regular. No bruising or petechia.  No fever, chills, n/v, cough, rash, dizziness, headaches, ice cravings, oral sores, SOB, chest pain, palpitations, abdominal pain or changes in bowel or bladder habits. No lymphadenopathy on exam. No swelling, tenderess, numbness or tingling in her extremities. No c/o joint aches or pains.  She is still on her diet for celiac disease and staying well hydrated. She has had no recent flares with this. Her weight is stable.    Medications:    Medication List       This list is accurate as of: 10/06/15 11:22 AM.  Always use your most recent med list.               levothyroxine 88 MCG tablet  Commonly known as:  SYNTHROID, LEVOTHROID  88 mcg daily before breakfast.     PRISTIQ PO  Take 1 tablet by mouth daily.     PRISTIQ 50 MG 24 hr tablet  Generic drug:  desvenlafaxine  Take 50 tablets by mouth daily.     Vitamin D (Ergocalciferol) 50000 units Caps capsule  Commonly known as:  DRISDOL  Take 50,000 Units by mouth every 30 (thirty) days.        Allergies: No Known Allergies  Past Medical History, Surgical history, Social history, and Family History were reviewed and updated.  Review of Systems: All other 10 point review of systems is negative.   Physical Exam:  vitals were not taken for this visit.  Wt Readings from Last 3 Encounters:  08/08/15 164 lb (74.39 kg)  03/04/15 163 lb (73.936 kg)  01/22/15 160 lb (72.576 kg)    Ocular: Sclerae  unicteric, pupils equal, round and reactive to light Ear-nose-throat: Oropharynx clear, dentition fair Lymphatic: No cervical supraclavicular or axillary adenopathy Lungs no rales or rhonchi, good excursion bilaterally Heart regular rate and rhythm, no murmur appreciated Abd soft, nontender, positive bowel sounds, no liver or spleen tip palpated on exam  MSK no focal spinal tenderness, no joint edema Neuro: non-focal, well-oriented, appropriate affect Breasts: Deferred  Lab Results  Component Value Date   WBC 4.8 10/06/2015   HGB 12.6 10/06/2015   HCT 38.4 10/06/2015   MCV 87 10/06/2015   PLT 187 10/06/2015   Lab Results  Component Value Date   FERRITIN 16 08/08/2015   IRON 66 08/08/2015   TIBC 343 08/08/2015   UIBC 276 08/08/2015   IRONPCTSAT 19* 08/08/2015   Lab Results  Component Value Date   RETICCTPCT 1.3 08/08/2015   RBC 4.44 10/06/2015   RETICCTABS 58.1 08/08/2015   No results found for: KPAFRELGTCHN, LAMBDASER, KAPLAMBRATIO No results found for: IGGSERUM, IGA, IGMSERUM No results found for: Ronnald Ramp, A1GS, A2GS, Tillman Sers, SPEI   Chemistry      Component Value Date/Time   NA 138 08/08/2015 1214   NA 138 01/22/2015 1412   K 4.1 08/08/2015 1214   K 3.8 01/22/2015 1412   CL 101 01/22/2015 1412  CO2 26 08/08/2015 1214   CO2 28 01/22/2015 1412   BUN 17.2 08/08/2015 1214   BUN 8 01/22/2015 1412   CREATININE 0.8 08/08/2015 1214   CREATININE 0.8 01/22/2015 1412      Component Value Date/Time   CALCIUM 9.1 08/08/2015 1214   CALCIUM 8.7 01/22/2015 1412   ALKPHOS 78 08/08/2015 1214   ALKPHOS 65 01/22/2015 1412   AST 21 08/08/2015 1214   AST 23 01/22/2015 1412   ALT 15 08/08/2015 1214   ALT 18 01/22/2015 1412   BILITOT <0.30 08/08/2015 1214   BILITOT 0.70 01/22/2015 1412     Impression and Plan: She is a 35 yo [name] with iron deficiency anemia and thrombocytopenia. She received feraheme in January and is feeling  better. She still has some mild fatigue at times.  We will see what her iron studies show and bring her in later this week for an infusion if needed. Her Hgb is stable at 12.6 with an MCV of 87.  We will plan to see her back in 4 months for lab work and follow-up.  She knows to contact Regina Mayo with any questions or concerns. We can certainly see her sooner if need be.   Eliezer Bottom, NP 3/6/201711:22 AM

## 2015-10-07 LAB — RETICULOCYTES: Reticulocyte Count: 1.2 % (ref 0.6–2.6)

## 2015-10-07 LAB — IRON AND TIBC
%SAT: 36 % (ref 21–57)
IRON: 104 ug/dL (ref 41–142)
TIBC: 286 ug/dL (ref 236–444)
UIBC: 182 ug/dL (ref 120–384)

## 2015-10-07 LAB — FERRITIN: FERRITIN: 33 ng/mL (ref 9–269)

## 2016-01-20 ENCOUNTER — Encounter: Payer: Self-pay | Admitting: Hematology & Oncology

## 2016-03-29 ENCOUNTER — Other Ambulatory Visit (HOSPITAL_BASED_OUTPATIENT_CLINIC_OR_DEPARTMENT_OTHER): Payer: BLUE CROSS/BLUE SHIELD

## 2016-03-29 ENCOUNTER — Encounter: Payer: Self-pay | Admitting: Family

## 2016-03-29 ENCOUNTER — Ambulatory Visit (HOSPITAL_BASED_OUTPATIENT_CLINIC_OR_DEPARTMENT_OTHER): Payer: BLUE CROSS/BLUE SHIELD | Admitting: Family

## 2016-03-29 VITALS — BP 124/78 | HR 76 | Temp 98.3°F | Resp 16 | Ht 66.0 in | Wt 163.0 lb

## 2016-03-29 DIAGNOSIS — K909 Intestinal malabsorption, unspecified: Secondary | ICD-10-CM

## 2016-03-29 DIAGNOSIS — D509 Iron deficiency anemia, unspecified: Secondary | ICD-10-CM

## 2016-03-29 DIAGNOSIS — D696 Thrombocytopenia, unspecified: Secondary | ICD-10-CM | POA: Diagnosis not present

## 2016-03-29 DIAGNOSIS — K900 Celiac disease: Secondary | ICD-10-CM | POA: Diagnosis not present

## 2016-03-29 LAB — CBC WITH DIFFERENTIAL (CANCER CENTER ONLY)
BASO#: 0 10*3/uL (ref 0.0–0.2)
BASO%: 0.7 % (ref 0.0–2.0)
EOS%: 3.3 % (ref 0.0–7.0)
Eosinophils Absolute: 0.2 10*3/uL (ref 0.0–0.5)
HCT: 40.6 % (ref 34.8–46.6)
HEMOGLOBIN: 13.6 g/dL (ref 11.6–15.9)
LYMPH#: 1.2 10*3/uL (ref 0.9–3.3)
LYMPH%: 20.3 % (ref 14.0–48.0)
MCH: 27.6 pg (ref 26.0–34.0)
MCHC: 33.5 g/dL (ref 32.0–36.0)
MCV: 83 fL (ref 81–101)
MONO#: 0.5 10*3/uL (ref 0.1–0.9)
MONO%: 8.3 % (ref 0.0–13.0)
NEUT%: 67.4 % (ref 39.6–80.0)
NEUTROS ABS: 3.9 10*3/uL (ref 1.5–6.5)
Platelets: 165 10*3/uL (ref 145–400)
RBC: 4.92 10*6/uL (ref 3.70–5.32)
RDW: 12.8 % (ref 11.1–15.7)
WBC: 5.8 10*3/uL (ref 3.9–10.0)

## 2016-03-29 LAB — IRON AND TIBC
%SAT: 33 % (ref 21–57)
IRON: 126 ug/dL (ref 41–142)
TIBC: 383 ug/dL (ref 236–444)
UIBC: 257 ug/dL (ref 120–384)

## 2016-03-29 LAB — FERRITIN: FERRITIN: 7 ng/mL — AB (ref 9–269)

## 2016-03-29 NOTE — Progress Notes (Signed)
Hematology and Oncology Follow Up Visit  Regina Mayo [id number] [birth year] 35 y.o. 03/29/2016   Principle Diagnosis:  Iron deficiency anemia   Current Therapy:   IV iron as indicated - last received in January 2017     Interim History:  Regina Mayo is here today for a follow-up. She is doing well and has no complaints at this time. She just returned from a trip to [place] with her family. They had a wonderful time.  She denies fatigue. Her cycles have been regular and not heavy. No episodes of bleeding, bruising or petechia.  No fever, chills, n/v, cough, rash, dizziness, headaches, ice cravings, oral sores, SOB, chest pain, palpitations, abdominal pain or changes in bowel or bladder habits. No lymphadenopathy on exam. No swelling, tenderess, numbness or tingling in her extremities. No c/o joint aches or pains.  She stays on a strict diet for celiac disease and makes sure to stay well hydrated. She has had no recent flares. Her weight is stable.    Medications:    Medication List       Accurate as of 03/29/16  4:17 PM. Always use your most recent med list.          levothyroxine 88 MCG tablet Commonly known as:  SYNTHROID, LEVOTHROID 88 mcg daily before breakfast.   PRISTIQ PO Take 1 tablet by mouth daily.   PRISTIQ 50 MG 24 hr tablet Generic drug:  desvenlafaxine Take 50 tablets by mouth daily.   Vitamin D (Ergocalciferol) 50000 units Caps capsule Commonly known as:  DRISDOL Take 50,000 Units by mouth every 30 (thirty) days.       Allergies: No Known Allergies  Past Medical History, Surgical history, Social history, and Family History were reviewed and updated.  Review of Systems: All other 10 point review of systems is negative.   Physical Exam:  height is 5' 6"  (1.676 m) and weight is 163 lb (73.9 kg). Her oral temperature is 98.3 F (36.8 C). Her blood pressure is 124/78 and her pulse is 76. Her respiration is 16.   Wt Readings from Last 3  Encounters:  03/29/16 163 lb (73.9 kg)  10/06/15 166 lb (75.3 kg)  08/08/15 164 lb (74.4 kg)    Ocular: Sclerae unicteric, pupils equal, round and reactive to light Ear-nose-throat: Oropharynx clear, dentition fair Lymphatic: No cervical supraclavicular or axillary adenopathy Lungs no rales or rhonchi, good excursion bilaterally Heart regular rate and rhythm, no murmur appreciated Abd soft, nontender, positive bowel sounds, no liver or spleen tip palpated on exam  MSK no focal spinal tenderness, no joint edema Neuro: non-focal, well-oriented, appropriate affect Breasts: Deferred  Lab Results  Component Value Date   WBC 5.8 03/29/2016   HGB 13.6 03/29/2016   HCT 40.6 03/29/2016   MCV 83 03/29/2016   PLT 165 03/29/2016   Lab Results  Component Value Date   FERRITIN 7 (L) 03/29/2016   IRON 126 03/29/2016   TIBC 383 03/29/2016   UIBC 257 03/29/2016   IRONPCTSAT 33 03/29/2016   Lab Results  Component Value Date   RETICCTPCT 1.3 08/08/2015   RBC 4.92 03/29/2016   RETICCTABS 58.1 08/08/2015   No results found for: KPAFRELGTCHN, LAMBDASER, KAPLAMBRATIO No results found for: IGGSERUM, IGA, IGMSERUM No results found for: Ronnald Ramp, A1GS, A2GS, BETS, BETA2SER, GAMS, MSPIKE, SPEI   Chemistry      Component Value Date/Time   NA 138 08/08/2015 1214   K 4.1 08/08/2015 1214   CL 101 01/22/2015 1412  CO2 26 08/08/2015 1214   BUN 17.2 08/08/2015 1214   CREATININE 0.8 08/08/2015 1214      Component Value Date/Time   CALCIUM 9.1 08/08/2015 1214   ALKPHOS 78 08/08/2015 1214   AST 21 08/08/2015 1214   ALT 15 08/08/2015 1214   BILITOT <0.30 08/08/2015 1214     Impression and Plan: She is a 35 yo [name] with iron deficiency anemia and thrombocytopenia. She last received Feraheme in January and had a nice response. She is asymptomatic at this time. Hgb is stable at 13.6 with an MCV 83.  Her ferritin is low at 7. We will go ahead and plan to bring her back in  later this week for an infusion.  We will then plan to see her back in 6 months for lab work and follow-up.  She knows to contact our office with any questions or concerns. We can certainly see her sooner if need be.   Eliezer Bottom, NP 8/28/20174:17 PM

## 2016-03-30 ENCOUNTER — Other Ambulatory Visit: Payer: Self-pay | Admitting: *Deleted

## 2016-03-30 ENCOUNTER — Telehealth: Payer: Self-pay | Admitting: *Deleted

## 2016-03-30 DIAGNOSIS — K909 Intestinal malabsorption, unspecified: Secondary | ICD-10-CM

## 2016-03-30 LAB — VITAMIN D 25 HYDROXY (VIT D DEFICIENCY, FRACTURES): Vitamin D, 25-Hydroxy: 30.9 ng/mL (ref 30.0–100.0)

## 2016-03-30 LAB — RETICULOCYTES: RETICULOCYTE COUNT: 0.9 % (ref 0.6–2.6)

## 2016-03-30 NOTE — Telephone Encounter (Signed)
-----   Message from Eliezer Bottom, NP sent at 03/29/2016  4:44 PM EDT ----- Regarding: Iron  Ferritin is low. She will need one dose of Feraheme this week please. Thank you!  Sarah   ----- Message ----- From: Interface, Lab In Three Zero One Sent: 03/29/2016  11:24 AM To: Eliezer Bottom, NP

## 2016-04-02 ENCOUNTER — Ambulatory Visit (HOSPITAL_BASED_OUTPATIENT_CLINIC_OR_DEPARTMENT_OTHER): Payer: BLUE CROSS/BLUE SHIELD

## 2016-04-02 VITALS — BP 115/73 | HR 82 | Temp 98.2°F | Resp 16

## 2016-04-02 DIAGNOSIS — D509 Iron deficiency anemia, unspecified: Secondary | ICD-10-CM | POA: Diagnosis not present

## 2016-04-02 DIAGNOSIS — K909 Intestinal malabsorption, unspecified: Secondary | ICD-10-CM | POA: Diagnosis not present

## 2016-04-02 DIAGNOSIS — K900 Celiac disease: Secondary | ICD-10-CM

## 2016-04-02 DIAGNOSIS — N921 Excessive and frequent menstruation with irregular cycle: Secondary | ICD-10-CM

## 2016-04-02 MED ORDER — SODIUM CHLORIDE 0.9 % IV SOLN
Freq: Once | INTRAVENOUS | Status: AC
  Administered 2016-04-02: 12:00:00 via INTRAVENOUS

## 2016-04-02 MED ORDER — FERUMOXYTOL INJECTION 510 MG/17 ML
510.0000 mg | Freq: Once | INTRAVENOUS | Status: AC
  Administered 2016-04-02: 510 mg via INTRAVENOUS
  Filled 2016-04-02: qty 17

## 2016-04-02 NOTE — Patient Instructions (Signed)

## 2016-08-10 DIAGNOSIS — D649 Anemia, unspecified: Secondary | ICD-10-CM | POA: Diagnosis not present

## 2016-08-10 DIAGNOSIS — E559 Vitamin D deficiency, unspecified: Secondary | ICD-10-CM | POA: Diagnosis not present

## 2016-09-29 ENCOUNTER — Other Ambulatory Visit: Payer: BLUE CROSS/BLUE SHIELD

## 2016-09-29 ENCOUNTER — Ambulatory Visit: Payer: BLUE CROSS/BLUE SHIELD | Admitting: Hematology & Oncology

## 2016-10-25 ENCOUNTER — Other Ambulatory Visit (HOSPITAL_COMMUNITY): Payer: 59 | Attending: Psychiatry

## 2016-10-26 ENCOUNTER — Ambulatory Visit (HOSPITAL_COMMUNITY): Payer: BLUE CROSS/BLUE SHIELD

## 2016-10-27 ENCOUNTER — Ambulatory Visit (HOSPITAL_COMMUNITY): Payer: BLUE CROSS/BLUE SHIELD

## 2016-10-28 ENCOUNTER — Ambulatory Visit (HOSPITAL_COMMUNITY): Payer: BLUE CROSS/BLUE SHIELD

## 2016-10-29 ENCOUNTER — Ambulatory Visit (HOSPITAL_COMMUNITY): Payer: BLUE CROSS/BLUE SHIELD

## 2016-11-01 ENCOUNTER — Ambulatory Visit (HOSPITAL_COMMUNITY): Payer: BLUE CROSS/BLUE SHIELD

## 2016-11-02 ENCOUNTER — Ambulatory Visit (HOSPITAL_COMMUNITY): Payer: BLUE CROSS/BLUE SHIELD

## 2016-11-03 ENCOUNTER — Ambulatory Visit (HOSPITAL_COMMUNITY): Payer: BLUE CROSS/BLUE SHIELD

## 2016-11-04 ENCOUNTER — Ambulatory Visit (HOSPITAL_COMMUNITY): Payer: BLUE CROSS/BLUE SHIELD

## 2016-11-05 ENCOUNTER — Ambulatory Visit (HOSPITAL_COMMUNITY): Payer: BLUE CROSS/BLUE SHIELD

## 2016-11-08 ENCOUNTER — Ambulatory Visit (HOSPITAL_COMMUNITY): Payer: BLUE CROSS/BLUE SHIELD

## 2016-11-09 ENCOUNTER — Ambulatory Visit (HOSPITAL_COMMUNITY): Payer: BLUE CROSS/BLUE SHIELD

## 2016-11-10 ENCOUNTER — Ambulatory Visit (HOSPITAL_COMMUNITY): Payer: BLUE CROSS/BLUE SHIELD

## 2016-11-11 ENCOUNTER — Ambulatory Visit (HOSPITAL_COMMUNITY): Payer: BLUE CROSS/BLUE SHIELD

## 2016-11-12 ENCOUNTER — Ambulatory Visit (HOSPITAL_COMMUNITY): Payer: BLUE CROSS/BLUE SHIELD

## 2016-11-15 ENCOUNTER — Ambulatory Visit (HOSPITAL_COMMUNITY): Payer: BLUE CROSS/BLUE SHIELD

## 2016-12-03 DIAGNOSIS — J3089 Other allergic rhinitis: Secondary | ICD-10-CM | POA: Diagnosis not present

## 2016-12-14 DIAGNOSIS — D649 Anemia, unspecified: Secondary | ICD-10-CM | POA: Diagnosis not present

## 2016-12-14 DIAGNOSIS — J309 Allergic rhinitis, unspecified: Secondary | ICD-10-CM | POA: Diagnosis not present

## 2016-12-14 DIAGNOSIS — J45909 Unspecified asthma, uncomplicated: Secondary | ICD-10-CM | POA: Diagnosis not present

## 2016-12-14 DIAGNOSIS — E039 Hypothyroidism, unspecified: Secondary | ICD-10-CM | POA: Diagnosis not present

## 2016-12-14 DIAGNOSIS — E538 Deficiency of other specified B group vitamins: Secondary | ICD-10-CM | POA: Diagnosis not present

## 2016-12-23 ENCOUNTER — Ambulatory Visit (HOSPITAL_BASED_OUTPATIENT_CLINIC_OR_DEPARTMENT_OTHER): Payer: 59

## 2016-12-23 ENCOUNTER — Other Ambulatory Visit: Payer: Self-pay

## 2016-12-23 ENCOUNTER — Ambulatory Visit (HOSPITAL_BASED_OUTPATIENT_CLINIC_OR_DEPARTMENT_OTHER): Payer: 59 | Admitting: Family

## 2016-12-23 VITALS — BP 137/79 | HR 80 | Temp 98.2°F | Resp 18 | Wt 167.1 lb

## 2016-12-23 VITALS — BP 112/70 | HR 67 | Temp 98.0°F | Resp 14

## 2016-12-23 DIAGNOSIS — D508 Other iron deficiency anemias: Secondary | ICD-10-CM

## 2016-12-23 DIAGNOSIS — N921 Excessive and frequent menstruation with irregular cycle: Secondary | ICD-10-CM

## 2016-12-23 DIAGNOSIS — K909 Intestinal malabsorption, unspecified: Secondary | ICD-10-CM

## 2016-12-23 DIAGNOSIS — K900 Celiac disease: Secondary | ICD-10-CM | POA: Diagnosis not present

## 2016-12-23 DIAGNOSIS — D696 Thrombocytopenia, unspecified: Secondary | ICD-10-CM

## 2016-12-23 DIAGNOSIS — D509 Iron deficiency anemia, unspecified: Secondary | ICD-10-CM

## 2016-12-23 MED ORDER — SODIUM CHLORIDE 0.9 % IV SOLN
Freq: Once | INTRAVENOUS | Status: AC
  Administered 2016-12-23: 13:00:00 via INTRAVENOUS

## 2016-12-23 MED ORDER — SODIUM CHLORIDE 0.9 % IV SOLN
510.0000 mg | Freq: Once | INTRAVENOUS | Status: AC
  Administered 2016-12-23: 510 mg via INTRAVENOUS
  Filled 2016-12-23: qty 17

## 2016-12-23 NOTE — Patient Instructions (Signed)

## 2016-12-23 NOTE — Progress Notes (Signed)
Hematology and Oncology Follow Up Visit  Regina Regina Mayo [id number] [birth year] 36 y.o. 12/23/2016   Principle Diagnosis:  Iron deficiency anemia   Current Therapy:   IV iron as indicated - last received in September 2017    Interim History:  Ms. Regina Regina Mayo is here today with c/o fatigue, weakness, SOB with activity, palpitations and anxiety. Iron saturation is 6% with a a ferritin of 5. Hgb is stable at 10.0 with an MCV of 76. We will give her IV iron today.  Her cycle is regular but has been lasting a few extra days. She also has celiac disease which certainly effects absorption.  She denies fever, chills, n/v, cough, rash, pica, chest pain, palpitations, abdominal pain or changes in bowel or bladder habits.  No swelling, tenderness, numbness or tingling in her extremities. No c/o pain.  She has maintained a good appetite and is staying well hydrated. Her weight is stable.  ECOG Performance Status: 1 - Symptomatic but completely ambulatory  Medications:  Allergies as of 12/23/2016   No Known Allergies     Medication List       Accurate as of 12/23/16  1:08 PM. Always use your most recent med list.          b complex vitamins tablet Take 1 tablet by mouth daily.   fluticasone 50 MCG/ACT nasal spray Commonly known as:  FLONASE   levothyroxine 88 MCG tablet Commonly known as:  SYNTHROID, LEVOTHROID 88 mcg daily before breakfast.   loratadine-pseudoephedrine 5-120 MG tablet Commonly known as:  CLARITIN-D 12-hour Take 1 tablet by mouth 2 (two) times daily.   montelukast 10 MG tablet Commonly known as:  SINGULAIR   traZODone 50 MG tablet Commonly known as:  DESYREL   TRINTELLIX 20 MG Tabs Generic drug:  vortioxetine HBr   vitamin B-12 100 MCG tablet Commonly known as:  CYANOCOBALAMIN Take 100 mcg by mouth daily.   Vitamin D (Ergocalciferol) 50000 units Caps capsule Commonly known as:  DRISDOL Take 50,000 Units by mouth every 30 (thirty) days.        Allergies: No Known Allergies  Past Medical History, Surgical history, Social history, and Family History were reviewed and updated.  Review of Systems: All other 10 point review of systems is negative.   Physical Exam:  weight is 167 lb 1.3 oz (75.8 kg). Her oral temperature is 98.2 F (36.8 C). Her blood pressure is 137/79 and her pulse is 80. Her respiration is 18 and oxygen saturation is 100%.   Wt Readings from Last 3 Encounters:  12/23/16 167 lb 1.3 oz (75.8 kg)  03/29/16 163 lb (73.9 kg)  10/06/15 166 lb (75.3 kg)    Ocular: Sclerae unicteric, pupils equal, round and reactive to light Ear-nose-throat: Oropharynx clear, dentition fair Lymphatic: No cervical, supraclavicular or axillary adenopathy Lungs no rales or rhonchi, good excursion bilaterally Heart regular rate and rhythm, no murmur appreciated Abd soft, nontender, positive bowel sounds, no liver or spleen tip palpated on exam, no fluid wave  MSK no focal spinal tenderness, no joint edema Neuro: non-focal, well-oriented, appropriate affect Breasts: Deferred   Lab Results  Component Value Date   WBC 5.8 03/29/2016   HGB 13.6 03/29/2016   HCT 40.6 03/29/2016   MCV 83 03/29/2016   PLT 165 03/29/2016   Lab Results  Component Value Date   FERRITIN 7 (L) 03/29/2016   IRON 126 03/29/2016   TIBC 383 03/29/2016   UIBC 257 03/29/2016   IRONPCTSAT 33 03/29/2016   Lab  Results  Component Value Date   RETICCTPCT 1.3 08/08/2015   RBC 4.92 03/29/2016   RETICCTABS 58.1 08/08/2015   No results found for: KPAFRELGTCHN, LAMBDASER, KAPLAMBRATIO No results found for: IGGSERUM, IGA, IGMSERUM No results found for: Odetta Pink, SPEI   Chemistry      Component Value Date/Time   NA 138 08/08/2015 1214   K 4.1 08/08/2015 1214   CL 101 01/22/2015 1412   CO2 26 08/08/2015 1214   BUN 17.2 08/08/2015 1214   CREATININE 0.8 08/08/2015 1214      Component Value  Date/Time   CALCIUM 9.1 08/08/2015 1214   ALKPHOS 78 08/08/2015 1214   AST 21 08/08/2015 1214   ALT 15 08/08/2015 1214   BILITOT <0.30 08/08/2015 1214      Impression and Plan: Ms. Regina Regina Mayo is a very pleasant 36 yo [place] woman with iron deficiency anemia and thrombocytopenia. She still has a cycle and also has celiac disease effecting her iron studies. Iron saturation today is 6% with a ferritin of 5. Hgb is stable and platelet count is 186.  We will give her IV iron tomorrow and again in 8 days.  We will plan to see her back in 6 weeks for repeat lab work and follow-up.  She will contact our office with any questions or concerns. We can certainly see her sooner if need be.   Eliezer Bottom, NP 5/24/20181:08 PM

## 2016-12-31 ENCOUNTER — Ambulatory Visit (HOSPITAL_BASED_OUTPATIENT_CLINIC_OR_DEPARTMENT_OTHER): Payer: 59

## 2016-12-31 VITALS — BP 126/70 | HR 69 | Temp 98.3°F | Resp 16

## 2016-12-31 DIAGNOSIS — D508 Other iron deficiency anemias: Secondary | ICD-10-CM

## 2016-12-31 DIAGNOSIS — K900 Celiac disease: Secondary | ICD-10-CM

## 2016-12-31 DIAGNOSIS — D509 Iron deficiency anemia, unspecified: Secondary | ICD-10-CM

## 2016-12-31 DIAGNOSIS — K909 Intestinal malabsorption, unspecified: Secondary | ICD-10-CM

## 2016-12-31 DIAGNOSIS — N921 Excessive and frequent menstruation with irregular cycle: Secondary | ICD-10-CM

## 2016-12-31 MED ORDER — FERUMOXYTOL INJECTION 510 MG/17 ML
510.0000 mg | Freq: Once | INTRAVENOUS | Status: AC
  Administered 2016-12-31: 510 mg via INTRAVENOUS
  Filled 2016-12-31: qty 17

## 2016-12-31 MED ORDER — SODIUM CHLORIDE 0.9 % IV SOLN
Freq: Once | INTRAVENOUS | Status: AC
  Administered 2016-12-31: 13:00:00 via INTRAVENOUS

## 2016-12-31 NOTE — Patient Instructions (Signed)

## 2017-02-07 ENCOUNTER — Ambulatory Visit: Payer: Self-pay | Admitting: Family

## 2017-02-07 ENCOUNTER — Other Ambulatory Visit: Payer: Self-pay

## 2017-02-08 ENCOUNTER — Other Ambulatory Visit (HOSPITAL_BASED_OUTPATIENT_CLINIC_OR_DEPARTMENT_OTHER): Payer: 59

## 2017-02-08 ENCOUNTER — Ambulatory Visit (HOSPITAL_BASED_OUTPATIENT_CLINIC_OR_DEPARTMENT_OTHER): Payer: 59 | Admitting: Family

## 2017-02-08 VITALS — BP 113/71 | HR 80 | Temp 98.2°F | Resp 18 | Wt 167.0 lb

## 2017-02-08 DIAGNOSIS — N920 Excessive and frequent menstruation with regular cycle: Secondary | ICD-10-CM | POA: Diagnosis not present

## 2017-02-08 DIAGNOSIS — K900 Celiac disease: Secondary | ICD-10-CM | POA: Diagnosis not present

## 2017-02-08 DIAGNOSIS — D508 Other iron deficiency anemias: Secondary | ICD-10-CM

## 2017-02-08 DIAGNOSIS — N921 Excessive and frequent menstruation with irregular cycle: Secondary | ICD-10-CM

## 2017-02-08 DIAGNOSIS — K909 Intestinal malabsorption, unspecified: Secondary | ICD-10-CM

## 2017-02-08 DIAGNOSIS — D500 Iron deficiency anemia secondary to blood loss (chronic): Secondary | ICD-10-CM

## 2017-02-08 LAB — CBC WITH DIFFERENTIAL (CANCER CENTER ONLY)
BASO#: 0 10*3/uL (ref 0.0–0.2)
BASO%: 0.4 % (ref 0.0–2.0)
EOS%: 1.5 % (ref 0.0–7.0)
Eosinophils Absolute: 0.1 10*3/uL (ref 0.0–0.5)
HCT: 38.9 % (ref 34.8–46.6)
HGB: 12.6 g/dL (ref 11.6–15.9)
LYMPH#: 0.9 10*3/uL (ref 0.9–3.3)
LYMPH%: 13.2 % — AB (ref 14.0–48.0)
MCH: 27 pg (ref 26.0–34.0)
MCHC: 32.4 g/dL (ref 32.0–36.0)
MCV: 83 fL (ref 81–101)
MONO#: 0.4 10*3/uL (ref 0.1–0.9)
MONO%: 5.7 % (ref 0.0–13.0)
NEUT#: 5.5 10*3/uL (ref 1.5–6.5)
NEUT%: 79.2 % (ref 39.6–80.0)
PLATELETS: 158 10*3/uL (ref 145–400)
RBC: 4.67 10*6/uL (ref 3.70–5.32)
RDW: 19.9 % — AB (ref 11.1–15.7)
WBC: 6.9 10*3/uL (ref 3.9–10.0)

## 2017-02-08 NOTE — Progress Notes (Signed)
Hematology and Oncology Follow Up Visit  Janiah Devinney [id number] [birth year] 36 y.o. 02/08/2017   Principle Diagnosis:  Iron deficiency anemia   Current Therapy:   IV iron as indicated - last received in May/June x 2 doses    Interim History:  Ms. Trick is here today with her daughter for follow-up. She is doing well and feels much better since receiving 2 doses of IV iron 6 weeks ago.  Her cycles are still heavy and regular.  No episodes of bleeding. She bruises occasionally but not in excess. No petechiae.  No lymphadenopathy found on exam.  No fever, chills, n/v, cough, rash, SOB, dizziness, chest pain, palpitations, abdominal pain or changes in bowel or bladder habits.  No swelling, tenderness, numbness or tingling in her extremities. No c/o pain.  She has maintained a good appetite and is staying well hydrated. Her weight is stable.   ECOG Performance Status: 0 - Asymptomatic  Medications:  Allergies as of 02/08/2017   No Known Allergies     Medication List       Accurate as of 02/08/17 10:30 AM. Always use your most recent med list.          b complex vitamins tablet Take 1 tablet by mouth daily.   fluticasone 50 MCG/ACT nasal spray Commonly known as:  FLONASE   levothyroxine 88 MCG tablet Commonly known as:  SYNTHROID, LEVOTHROID 88 mcg daily before breakfast.   loratadine-pseudoephedrine 5-120 MG tablet Commonly known as:  CLARITIN-D 12-hour Take 1 tablet by mouth 2 (two) times daily.   montelukast 10 MG tablet Commonly known as:  SINGULAIR   traZODone 50 MG tablet Commonly known as:  DESYREL   TRINTELLIX 20 MG Tabs Generic drug:  vortioxetine HBr   vitamin B-12 100 MCG tablet Commonly known as:  CYANOCOBALAMIN Take 100 mcg by mouth daily.   Vitamin D (Ergocalciferol) 50000 units Caps capsule Commonly known as:  DRISDOL Take 50,000 Units by mouth every 30 (thirty) days.       Allergies: No Known Allergies  Past Medical History,  Surgical history, Social history, and Family History were reviewed and updated.  Review of Systems: All other 10 point review of systems is negative.   Physical Exam:  vitals were not taken for this visit.  Wt Readings from Last 3 Encounters:  12/23/16 167 lb 1.3 oz (75.8 kg)  03/29/16 163 lb (73.9 kg)  10/06/15 166 lb (75.3 kg)    Ocular: Sclerae unicteric, pupils equal, round and reactive to light Ear-nose-throat: Oropharynx clear, dentition fair Lymphatic: No cervical, supraclavicular or axillary adenopathy Lungs no rales or rhonchi, good excursion bilaterally Heart regular rate and rhythm, no murmur appreciated Abd soft, nontender, positive bowel sounds, no liver or spleen tip palpated on exam, no fluid wave  MSK no focal spinal tenderness, no joint edema Neuro: non-focal, well-oriented, appropriate affect Breasts: Deferred   Lab Results  Component Value Date   WBC 6.9 02/08/2017   HGB 12.6 02/08/2017   HCT 38.9 02/08/2017   MCV 83 02/08/2017   PLT 158 02/08/2017   Lab Results  Component Value Date   FERRITIN 7 (L) 03/29/2016   IRON 126 03/29/2016   TIBC 383 03/29/2016   UIBC 257 03/29/2016   IRONPCTSAT 33 03/29/2016   Lab Results  Component Value Date   RETICCTPCT 1.3 08/08/2015   RBC 4.67 02/08/2017   RETICCTABS 58.1 08/08/2015   No results found for: KPAFRELGTCHN, LAMBDASER, KAPLAMBRATIO No results found for: IGGSERUM, IGA, IGMSERUM No results  found for: Odetta Pink, SPEI   Chemistry      Component Value Date/Time   NA 138 08/08/2015 1214   K 4.1 08/08/2015 1214   CL 101 01/22/2015 1412   CO2 26 08/08/2015 1214   BUN 17.2 08/08/2015 1214   CREATININE 0.8 08/08/2015 1214      Component Value Date/Time   CALCIUM 9.1 08/08/2015 1214   ALKPHOS 78 08/08/2015 1214   AST 21 08/08/2015 1214   ALT 15 08/08/2015 1214   BILITOT <0.30 08/08/2015 1214      Impression and Plan: Ms. Gieselman is a  very pleasant 36 yo [name] with iron deficiency anemia secondary to menorrhagia and malabsorption with celiac disease. She is doing well and has no complaints at this time.  We will see what her iron studies show and bring her back in later this week for an infusion if needed.  We will plan to see her back again for repeat lab work and follow-up in 3 months.  She will contact our office with any questions or concerns. We can certainly see her sooner if need be.   Eliezer Bottom, NP 7/10/201810:30 AM

## 2017-02-09 LAB — IRON AND TIBC
%SAT: 40 % (ref 21–57)
IRON: 117 ug/dL (ref 41–142)
TIBC: 295 ug/dL (ref 236–444)
UIBC: 178 ug/dL (ref 120–384)

## 2017-02-09 LAB — FERRITIN: Ferritin: 45 ng/ml (ref 9–269)

## 2017-03-25 DIAGNOSIS — J3081 Allergic rhinitis due to animal (cat) (dog) hair and dander: Secondary | ICD-10-CM | POA: Diagnosis not present

## 2017-03-25 DIAGNOSIS — R0602 Shortness of breath: Secondary | ICD-10-CM | POA: Diagnosis not present

## 2017-03-25 DIAGNOSIS — J301 Allergic rhinitis due to pollen: Secondary | ICD-10-CM | POA: Diagnosis not present

## 2017-03-25 DIAGNOSIS — H1045 Other chronic allergic conjunctivitis: Secondary | ICD-10-CM | POA: Diagnosis not present

## 2017-05-11 ENCOUNTER — Other Ambulatory Visit: Payer: 59

## 2017-05-11 ENCOUNTER — Ambulatory Visit: Payer: 59 | Admitting: Hematology & Oncology

## 2017-07-04 DIAGNOSIS — D509 Iron deficiency anemia, unspecified: Secondary | ICD-10-CM | POA: Diagnosis not present

## 2017-07-04 DIAGNOSIS — N920 Excessive and frequent menstruation with regular cycle: Secondary | ICD-10-CM | POA: Diagnosis not present

## 2017-07-04 DIAGNOSIS — E039 Hypothyroidism, unspecified: Secondary | ICD-10-CM | POA: Diagnosis not present

## 2017-07-04 DIAGNOSIS — Z79899 Other long term (current) drug therapy: Secondary | ICD-10-CM | POA: Diagnosis not present

## 2017-08-30 DIAGNOSIS — D509 Iron deficiency anemia, unspecified: Secondary | ICD-10-CM | POA: Diagnosis not present

## 2017-08-30 DIAGNOSIS — R5383 Other fatigue: Secondary | ICD-10-CM | POA: Diagnosis not present

## 2017-08-30 DIAGNOSIS — K900 Celiac disease: Secondary | ICD-10-CM | POA: Diagnosis not present

## 2018-07-06 DIAGNOSIS — Z6826 Body mass index (BMI) 26.0-26.9, adult: Secondary | ICD-10-CM | POA: Diagnosis not present

## 2018-07-06 DIAGNOSIS — E039 Hypothyroidism, unspecified: Secondary | ICD-10-CM | POA: Diagnosis not present

## 2018-07-06 DIAGNOSIS — E663 Overweight: Secondary | ICD-10-CM | POA: Diagnosis not present

## 2018-07-06 DIAGNOSIS — E559 Vitamin D deficiency, unspecified: Secondary | ICD-10-CM | POA: Diagnosis not present

## 2018-07-06 DIAGNOSIS — E538 Deficiency of other specified B group vitamins: Secondary | ICD-10-CM | POA: Diagnosis not present

## 2018-07-06 DIAGNOSIS — D509 Iron deficiency anemia, unspecified: Secondary | ICD-10-CM | POA: Diagnosis not present

## 2018-07-18 DIAGNOSIS — K900 Celiac disease: Secondary | ICD-10-CM | POA: Diagnosis not present

## 2018-07-18 DIAGNOSIS — D509 Iron deficiency anemia, unspecified: Secondary | ICD-10-CM | POA: Diagnosis not present

## 2018-07-18 DIAGNOSIS — R635 Abnormal weight gain: Secondary | ICD-10-CM | POA: Diagnosis not present

## 2020-02-23 ENCOUNTER — Emergency Department (HOSPITAL_BASED_OUTPATIENT_CLINIC_OR_DEPARTMENT_OTHER)
Admission: EM | Admit: 2020-02-23 | Discharge: 2020-02-23 | Disposition: A | Discharge status: Home / Self Care | Payer: BC Managed Care – PPO | Attending: Emergency Medicine | Admitting: Emergency Medicine

## 2020-02-23 ENCOUNTER — Encounter (HOSPITAL_BASED_OUTPATIENT_CLINIC_OR_DEPARTMENT_OTHER): Payer: Self-pay | Admitting: Emergency Medicine

## 2020-02-23 ENCOUNTER — Other Ambulatory Visit: Payer: Self-pay

## 2020-02-23 DIAGNOSIS — R21 Rash and other nonspecific skin eruption: Secondary | ICD-10-CM | POA: Insufficient documentation

## 2020-02-23 DIAGNOSIS — K1329 Other disturbances of oral epithelium, including tongue: Secondary | ICD-10-CM | POA: Insufficient documentation

## 2020-02-23 DIAGNOSIS — H05229 Edema of unspecified orbit: Secondary | ICD-10-CM | POA: Diagnosis not present

## 2020-02-23 DIAGNOSIS — D509 Iron deficiency anemia, unspecified: Secondary | ICD-10-CM | POA: Insufficient documentation

## 2020-02-23 DIAGNOSIS — T7840XA Allergy, unspecified, initial encounter: Secondary | ICD-10-CM | POA: Diagnosis not present

## 2020-02-23 DIAGNOSIS — J301 Allergic rhinitis due to pollen: Secondary | ICD-10-CM | POA: Insufficient documentation

## 2020-02-23 DIAGNOSIS — R0981 Nasal congestion: Secondary | ICD-10-CM | POA: Insufficient documentation

## 2020-02-23 HISTORY — DX: Allergy, unspecified, initial encounter: T78.40XA

## 2020-02-23 LAB — PREGNANCY, URINE: Preg Test, Ur: NEGATIVE

## 2020-02-23 MED ORDER — PREDNISONE 20 MG PO TABS: 40.0000 mg | ORAL_TABLET | Freq: Every day | ORAL | 0 refills | Status: AC

## 2020-02-23 MED ORDER — PREDNISONE 50 MG PO TABS
60.0000 mg | ORAL_TABLET | Freq: Once | ORAL | Status: AC
  Administered 2020-02-23: 60 mg via ORAL
  Filled 2020-02-23: qty 1

## 2020-02-23 MED ORDER — EPINEPHRINE 0.3 MG/0.3ML IJ SOAJ: 0.3000 mg | INTRAMUSCULAR | 0 refills | Status: DC | PRN

## 2020-02-23 NOTE — Discharge Instructions (Signed)
You may take benadryl 25-50 mg every 4 hours as needed for rash or itching If you develop new/worse/recurrent symptoms, then call 911 and/or use your epi pen

## 2020-02-23 NOTE — ED Notes (Signed)
Pharmacy and medications updated with patient

## 2020-02-23 NOTE — ED Triage Notes (Addendum)
Pt picking flowers about 1130, eyes became swollen and itchy, no rash. Pt states had diarrhea and felt throat closing. Pt called 911 in parking lot and was given 74m of benadryl. Pt refused transport and went home and came here with husband. Alert, airway intact. VSS. Alert amb. PT state feels better, No angio edema or rash noted in triage. Eyes swollen, skin slightly red, tongue and throat feel "thick' per pt.

## 2020-02-23 NOTE — ED Notes (Signed)
Pt discharged to home. Discharge instructions have been discussed with patient and/or family members. Pt verbally acknowledges understanding d/c instructions, and endorses comprehension to checkout at registration before leaving.  °

## 2020-02-23 NOTE — ED Provider Notes (Signed)
[facility] EMERGENCY DEPARTMENT Provider Note   CSN: [id number] Arrival date & time: 02/23/20  1304     History Chief Complaint  Patient presents with  . Allergic Reaction    Regina Mayo is a 39 y.o. female.  HPI 39-year-old female presents with concern for allergic reaction.  She was at a store and was holding some flowers and smelling them.  About 5-10 minutes later she was at the grocery store and started to all of a sudden have congestion, throat closing sensation, tongue tingling, and started to break out into a rash.  Had periorbital swelling.  She was very itchy.  She felt like she suddenly had to have a bowel movement.  Pick up her daughter and then went to [facility] to go buy Benadryl but was starting to feel worse so she called 911.  They gave her Benadryl at about 12:30 PM and now she is feeling dramatically better.  Feels a little congested but is better than before.  Her throat symptoms are essentially resolved.  She did vomit once before the Benadryl but now that is resolved.  She states she has had some allergic reaction in the past but does not currently take antihistamines and wonders if this is contributing.  She also had gluten and has celiac disease and wonders if that is causing some of her symptoms.   Past Medical History:  Diagnosis Date  . Allergies   . 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 Iron deficiency anemia 08/08/2015  . 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 Menometrorrhagia 08/08/2015    Patient Active Problem List   Diagnosis Date Noted  . Iron deficiency anemia 08/08/2015  . 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 Menometrorrhagia 08/08/2015  . 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 No pertinent surgical history.   OB History    Gravida  1   Para      Term      Preterm      AB      Living        SAB      TAB      Ectopic      Multiple      Live Births              No family history on file.  Social History    Tobacco Use  . Smoking status: Never Smoker  . Smokeless tobacco: Never Used  . Tobacco comment: NEVER USED TOBACCO  Substance Use Topics  . Alcohol use: Not on file  . Drug use: Not on file    Home Medications Prior to Admission medications   Medication Sig Start Date End Date Taking? Authorizing Provider  b complex vitamins tablet Take 1 tablet by mouth daily.    [provider]  EPINEPHrine 0.3 mg/0.3 mL IJ SOAJ injection Inject 0.3 mLs (0.3 mg total) into the muscle as needed for anaphylaxis. 02/23/20   Goldston, Scott, MD  fluticasone (FLONASE) 50 MCG/ACT nasal spray  12/03/16   [provider]  levothyroxine (SYNTHROID, LEVOTHROID) 88 MCG tablet 88 mcg daily before breakfast.     [provider]  loratadine-pseudoephedrine (CLARITIN-D 12-HOUR) 5-120 MG tablet Take 1 tablet by mouth 2 (two) times daily.    [provider]  montelukast (SINGULAIR) 10 MG tablet  12/03/16   [provider]  predniSONE (DELTASONE) 20 MG tablet Take 2 tablets (40 mg total) by mouth daily for 4 days. 02/23/20 02/27/20  Goldston, Scott, MD  traZODone (  DESYREL) 50 MG tablet  10/20/16   [provider]  TRINTELLIX 20 MG TABS  12/08/16   [provider]  vitamin B-12 (CYANOCOBALAMIN) 100 MCG tablet Take 100 mcg by mouth daily.    [provider]  Vitamin D, Ergocalciferol, (DRISDOL) 50000 UNITS CAPS capsule Take 50,000 Units by mouth every 30 (thirty) days.  10/08/14   [provider]    Allergies    Patient has no known allergies.  Review of Systems   Review of Systems  Constitutional: Negative for fever.  HENT: Positive for congestion and facial swelling.   Respiratory: Positive for shortness of breath.   Gastrointestinal: Positive for vomiting. Negative for diarrhea.  Skin: Positive for rash.  All other systems reviewed and are negative.   Physical Exam Updated Vital Signs BP 124/81 (BP Location: Right Arm)   Pulse 79    Temp 98.4 F (36.9 C) (Oral)   Resp 20   Ht 5' 6" (1.676 m)   Wt 76.2 kg   SpO2 100%   Breastfeeding Unknown   BMI 27.12 kg/m   Physical Exam Vitals and nursing note reviewed.  Constitutional:      General: She is not in acute distress.    Appearance: She is well-developed. She is not ill-appearing or diaphoretic.  HENT:     Head: Normocephalic and atraumatic.     Right Ear: External ear normal.     Left Ear: External ear normal.     Nose: Nose normal.     Mouth/Throat:     Pharynx: No oropharyngeal exudate or posterior oropharyngeal erythema.     Comments: Tongue is normal size, and she has normal phonation. No obvious angioedema Eyes:     General:        Right eye: No discharge.        Left eye: No discharge.     Comments: Minimal periorbital edema  Cardiovascular:     Rate and Rhythm: Normal rate and regular rhythm.     Heart sounds: Normal heart sounds.  Pulmonary:     Effort: Pulmonary effort is normal.     Breath sounds: Normal breath sounds. No stridor. No wheezing or rales.  Abdominal:     Palpations: Abdomen is soft.     Tenderness: There is no abdominal tenderness.  Skin:    General: Skin is warm and dry.     Findings: Rash (scattered mild rash vs areas she has scratched) present.  Neurological:     Mental Status: She is alert.  Psychiatric:        Mood and Affect: Mood is not anxious.     ED Results / Procedures / Treatments   Labs (all labs ordered are listed, but only abnormal results are displayed) Labs Reviewed  PREGNANCY, URINE    EKG None  Radiology No results found.  Procedures Procedures (including critical care time)  Medications Ordered in ED Medications  predniSONE (DELTASONE) tablet 60 mg (60 mg Oral Given 02/23/20 1408)    ED Course  I have reviewed the triage vital signs and the nursing notes.  Pertinent labs & imaging results that were available during my care of the patient were reviewed by me and considered in my  medical decision making (see chart for details).    MDM Rules/Calculators/A&P                          At this point, after the Benadryl given by EMS,  the patient is quite well-appearing.  Has essentially no further symptoms besides some nasal congestion.  Technically I think she would have met criteria for anaphylaxis in the parking lot, but at this point it would be overkill to give her epinephrine.  She was given prednisone and I think a burst is reasonable in addition to Benadryl at home.  I will give her a prescription for EpiPen.  Otherwise she should follow up with allergist for further work-up of what could have caused the symptoms. Final Clinical Impression(s) / ED Diagnoses Final diagnoses:  Allergic reaction, initial encounter    Rx / DC Orders ED Discharge Orders         Ordered    predniSONE (DELTASONE) 20 MG tablet  Daily     Discontinue  Reprint     02/23/20 1458    EPINEPHrine 0.3 mg/0.3 mL IJ SOAJ injection  As needed     Discontinue  Reprint     02/23/20 1458           Goldston, Scott, MD 02/23/20 1505  

## 2020-02-26 DIAGNOSIS — J309 Allergic rhinitis, unspecified: Secondary | ICD-10-CM | POA: Diagnosis not present

## 2020-02-26 DIAGNOSIS — R05 Cough: Secondary | ICD-10-CM | POA: Diagnosis not present

## 2020-02-26 DIAGNOSIS — Z09 Encounter for follow-up examination after completed treatment for conditions other than malignant neoplasm: Secondary | ICD-10-CM | POA: Diagnosis not present

## 2020-02-26 DIAGNOSIS — J45909 Unspecified asthma, uncomplicated: Secondary | ICD-10-CM | POA: Diagnosis not present

## 2020-04-02 DIAGNOSIS — J301 Allergic rhinitis due to pollen: Secondary | ICD-10-CM | POA: Diagnosis not present

## 2020-04-02 DIAGNOSIS — R062 Wheezing: Secondary | ICD-10-CM | POA: Diagnosis not present

## 2020-04-02 DIAGNOSIS — Z87892 Personal history of anaphylaxis: Secondary | ICD-10-CM | POA: Diagnosis not present

## 2020-04-02 DIAGNOSIS — T781XXA Other adverse food reactions, not elsewhere classified, initial encounter: Secondary | ICD-10-CM | POA: Diagnosis not present

## 2020-04-08 DIAGNOSIS — Z124 Encounter for screening for malignant neoplasm of cervix: Secondary | ICD-10-CM | POA: Diagnosis not present

## 2020-04-08 DIAGNOSIS — E538 Deficiency of other specified B group vitamins: Secondary | ICD-10-CM | POA: Diagnosis not present

## 2020-04-08 DIAGNOSIS — Z0000 Encounter for general adult medical examination without abnormal findings: Secondary | ICD-10-CM | POA: Diagnosis not present

## 2020-04-08 DIAGNOSIS — D509 Iron deficiency anemia, unspecified: Secondary | ICD-10-CM | POA: Diagnosis not present

## 2020-04-08 DIAGNOSIS — E559 Vitamin D deficiency, unspecified: Secondary | ICD-10-CM | POA: Diagnosis not present

## 2020-04-13 ENCOUNTER — Other Ambulatory Visit: Payer: Self-pay

## 2020-04-13 ENCOUNTER — Emergency Department (HOSPITAL_BASED_OUTPATIENT_CLINIC_OR_DEPARTMENT_OTHER)
Admission: EM | Admit: 2020-04-13 | Discharge: 2020-04-13 | Disposition: A | Discharge status: Home / Self Care | Payer: BC Managed Care – PPO | Attending: Emergency Medicine | Admitting: Emergency Medicine

## 2020-04-13 ENCOUNTER — Encounter (HOSPITAL_BASED_OUTPATIENT_CLINIC_OR_DEPARTMENT_OTHER): Payer: Self-pay | Admitting: Emergency Medicine

## 2020-04-13 DIAGNOSIS — S61204A Unspecified open wound of right ring finger without damage to nail, initial encounter: Secondary | ICD-10-CM | POA: Insufficient documentation

## 2020-04-13 DIAGNOSIS — S61219A Laceration without foreign body of unspecified finger without damage to nail, initial encounter: Secondary | ICD-10-CM

## 2020-04-13 DIAGNOSIS — Y929 Unspecified place or not applicable: Secondary | ICD-10-CM | POA: Diagnosis not present

## 2020-04-13 DIAGNOSIS — M791 Myalgia, unspecified site: Secondary | ICD-10-CM | POA: Insufficient documentation

## 2020-04-13 DIAGNOSIS — Z23 Encounter for immunization: Secondary | ICD-10-CM | POA: Diagnosis not present

## 2020-04-13 DIAGNOSIS — Y999 Unspecified external cause status: Secondary | ICD-10-CM | POA: Insufficient documentation

## 2020-04-13 DIAGNOSIS — W260XXA Contact with knife, initial encounter: Secondary | ICD-10-CM | POA: Insufficient documentation

## 2020-04-13 DIAGNOSIS — Y9389 Activity, other specified: Secondary | ICD-10-CM | POA: Diagnosis not present

## 2020-04-13 MED ORDER — TETANUS-DIPHTH-ACELL PERTUSSIS 5-2.5-18.5 LF-MCG/0.5 IM SUSP
0.5000 mL | Freq: Once | INTRAMUSCULAR | Status: AC
  Administered 2020-04-13: 0.5 mL via INTRAMUSCULAR
  Filled 2020-04-13: qty 0.5

## 2020-04-13 NOTE — ED Triage Notes (Signed)
Patient states that she was cutting vegetables and cut her right ring finger. She has an injury to her right index finger. The patient was seen at urgent care and they tried to give her a digital block and was not numb

## 2020-04-13 NOTE — ED Provider Notes (Signed)
[facility] EMERGENCY DEPARTMENT Provider Note   CSN: [id number] Arrival date & time: 04/13/20  1437     History Chief Complaint  Patient presents with  . Finger Injury    Regina Mayo is a 39 y.o. female.  HPI Patient is a 38 year old female with a medical history as noted below.  Patient states a few hours ago she was cutting vegetables and the knife slipped cutting off a small piece of skin to the distal tip of the right ring finger.  Patient was not able to control her bleeding so she went to urgent care.  They performed a digital block on the finger and were also unable to control her bleeding with direct pressure so they sent her to the emergency department.  Patient presented with gauze wrapped around her finger.  This was removed and patient has no active bleeding at this time.  No tenderness at this point, secondary to her digital block.  Patient has no other complaints at this time.    Past Medical History:  Diagnosis Date  . Allergies   . 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 Iron deficiency anemia 08/08/2015  . 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 Menometrorrhagia 08/08/2015    Patient Active Problem List   Diagnosis Date Noted  . Iron deficiency anemia 08/08/2015  . 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 Menometrorrhagia 08/08/2015  . 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 No pertinent surgical history.   OB History    Gravida  1   Para      Term      Preterm      AB      Living        SAB      TAB      Ectopic      Multiple      Live Births              History reviewed. No pertinent family history.  Social History   Tobacco Use  . Smoking status: Never Smoker  . Smokeless tobacco: Never Used  . Tobacco comment: NEVER USED TOBACCO  Substance Use Topics  . Alcohol use: Not on file  . Drug use: Not on file    Home Medications Prior to Admission medications   Medication Sig Start Date End Date Taking? Authorizing Provider    b complex vitamins tablet Take 1 tablet by mouth daily.    [provider]  EPINEPHrine 0.3 mg/0.3 mL IJ SOAJ injection Inject 0.3 mLs (0.3 mg total) into the muscle as needed for anaphylaxis. 02/23/20   Sherwood Gambler, MD  fluticasone Asencion Islam) 50 MCG/ACT nasal spray  12/03/16   [provider]  levothyroxine (SYNTHROID, LEVOTHROID) 88 MCG tablet 88 mcg daily before breakfast.     [provider]  loratadine-pseudoephedrine (CLARITIN-D 12-HOUR) 5-120 MG tablet Take 1 tablet by mouth 2 (two) times daily.    [provider]  montelukast (SINGULAIR) 10 MG tablet  12/03/16   [provider]  traZODone (DESYREL) 50 MG tablet  10/20/16   [provider]  TRINTELLIX 20 MG TABS  12/08/16   [provider]  vitamin B-12 (CYANOCOBALAMIN) 100 MCG tablet Take 100 mcg by mouth daily.    [provider]  Vitamin D, Ergocalciferol, (DRISDOL) 50000 UNITS CAPS capsule Take 50,000 Units by mouth every 30 (thirty) days.  10/08/14   [provider]    Allergies    Patient has no known allergies.  Review of  Systems   Review of Systems  Musculoskeletal: Positive for myalgias.  Skin: Positive for wound.  Neurological: Positive for numbness (Digital block). Negative for weakness.   Physical Exam Updated Vital Signs BP (!) 121/91 (BP Location: Right Arm)   Pulse 81   Temp 98.7 F (37.1 C) (Oral)   Resp 18   Ht 5' 7"  (1.702 m)   Wt 75.3 kg   SpO2 100%   BMI 26.00 kg/m   Physical Exam Vitals and nursing note reviewed.  Constitutional:      General: She is not in acute distress.    Appearance: Normal appearance. She is well-developed and normal weight. She is not ill-appearing.  HENT:     Head: Normocephalic and atraumatic.     Right Ear: External ear normal.     Left Ear: External ear normal.  Eyes:     General: No scleral icterus.       Right eye: No discharge.        Left eye: No discharge.     Conjunctiva/sclera:  Conjunctivae normal.  Neck:     Trachea: No tracheal deviation.  Cardiovascular:     Rate and Rhythm: Normal rate.  Pulmonary:     Effort: Pulmonary effort is normal. No respiratory distress.     Breath sounds: No stridor.  Abdominal:     General: There is no distension.  Musculoskeletal:        General: No swelling or deformity.     Cervical back: Neck supple.  Skin:    General: Skin is warm and dry.     Findings: No rash.     Comments: 0.5 cm portion of tissue cut off the distal tip of the right ring finger.  No nailbed involvement.  No active bleeding.  Unable to assess pain secondary to a digital block that was performed prior to arrival.  Full range of motion of the digit.  2+ radial pulses.  Neurological:     Mental Status: She is alert.     Cranial Nerves: Cranial nerve deficit: no gross deficits.     ED Results / Procedures / Treatments   Labs (all labs ordered are listed, but only abnormal results are displayed) Labs Reviewed - No data to display  EKG None  Radiology No results found.  Procedures Procedures (including critical care time)  Medications Ordered in ED Medications  Tdap (BOOSTRIX) injection 0.5 mL (has no administration in time range)   ED Course  I have reviewed the triage vital signs and the nursing notes.  Pertinent labs & imaging results that were available during my care of the patient were reviewed by me and considered in my medical decision making (see chart for details).    MDM Rules/Calculators/A&P                          Pt is a 39 y.o. female that presents with a history, physical exam, and ED Clinical Course as noted above.   Patient presents with a wound to the distal tip of the right ring finger.  Patient was unable to control the bleeding at urgent care so she was sent to the ER.  Finger is numb currently due to a digital block that was performed on the finger prior to arrival.  Patient has no active bleeding at this time.  We  will have the nursing staff rewrap the wound and clean it.  Patient is unsure of the timing of  her last Tdap, so we will update this in the emergency department.  Patient is hemodynamically stable and in NAD at the time of d/c. Evaluation does not show pathology that would require ongoing emergent intervention or inpatient treatment. I explained the diagnosis to the patient. Patient is comfortable with above plan and is stable for discharge at this time. All questions were answered prior to disposition. Strict return precautions for returning to the ED were discussed. Encouraged follow up with PCP.    An After Visit Summary was printed and given to the patient.  Patient discharged to home/self care.  Condition at discharge: Stable  Note: Portions of this report may have been transcribed using voice recognition software. Every effort was made to ensure accuracy; however, inadvertent computerized transcription errors may be present.   Final Clinical Impression(s) / ED Diagnoses Final diagnoses:  Cut of finger    Rx / DC Orders ED Discharge Orders    None       Rayna Sexton, PA-C 04/13/20 1736    Drenda Freeze, MD 04/14/20 [phone]

## 2020-07-08 DIAGNOSIS — Z20822 Contact with and (suspected) exposure to covid-19: Secondary | ICD-10-CM | POA: Diagnosis not present

## 2020-07-30 ENCOUNTER — Telehealth: Payer: Self-pay | Admitting: Oncology

## 2020-07-30 NOTE — Telephone Encounter (Signed)
12/13 Patient Unreachable - Mailed Unreachable Letter

## 2020-09-03 DIAGNOSIS — Z20822 Contact with and (suspected) exposure to covid-19: Secondary | ICD-10-CM | POA: Diagnosis not present

## 2020-10-01 ENCOUNTER — Other Ambulatory Visit: Payer: Self-pay | Admitting: Family

## 2020-10-01 DIAGNOSIS — N921 Excessive and frequent menstruation with irregular cycle: Secondary | ICD-10-CM

## 2020-10-01 DIAGNOSIS — D500 Iron deficiency anemia secondary to blood loss (chronic): Secondary | ICD-10-CM

## 2020-10-02 ENCOUNTER — Encounter: Payer: Self-pay | Admitting: Family

## 2020-10-02 ENCOUNTER — Inpatient Hospital Stay: Payer: BC Managed Care – PPO

## 2020-10-02 ENCOUNTER — Other Ambulatory Visit: Payer: Self-pay

## 2020-10-02 ENCOUNTER — Inpatient Hospital Stay: Payer: BC Managed Care – PPO | Attending: Family

## 2020-10-02 ENCOUNTER — Inpatient Hospital Stay (HOSPITAL_BASED_OUTPATIENT_CLINIC_OR_DEPARTMENT_OTHER): Payer: BC Managed Care – PPO | Admitting: Family

## 2020-10-02 VITALS — BP 117/68 | HR 73 | Temp 97.9°F | Resp 18 | Ht 66.5 in | Wt 161.0 lb

## 2020-10-02 VITALS — BP 120/83 | HR 69

## 2020-10-02 DIAGNOSIS — D500 Iron deficiency anemia secondary to blood loss (chronic): Secondary | ICD-10-CM | POA: Diagnosis not present

## 2020-10-02 DIAGNOSIS — K909 Intestinal malabsorption, unspecified: Secondary | ICD-10-CM

## 2020-10-02 DIAGNOSIS — E538 Deficiency of other specified B group vitamins: Secondary | ICD-10-CM | POA: Insufficient documentation

## 2020-10-02 DIAGNOSIS — D649 Anemia, unspecified: Secondary | ICD-10-CM

## 2020-10-02 DIAGNOSIS — N921 Excessive and frequent menstruation with irregular cycle: Secondary | ICD-10-CM

## 2020-10-02 DIAGNOSIS — N920 Excessive and frequent menstruation with regular cycle: Secondary | ICD-10-CM | POA: Insufficient documentation

## 2020-10-02 DIAGNOSIS — D508 Other iron deficiency anemias: Secondary | ICD-10-CM

## 2020-10-02 DIAGNOSIS — K900 Celiac disease: Secondary | ICD-10-CM

## 2020-10-02 LAB — CBC WITH DIFFERENTIAL (CANCER CENTER ONLY)
Abs Immature Granulocytes: 0.02 10*3/uL (ref 0.00–0.07)
Basophils Absolute: 0.1 10*3/uL (ref 0.0–0.1)
Basophils Relative: 1 %
Eosinophils Absolute: 0.2 10*3/uL (ref 0.0–0.5)
Eosinophils Relative: 3 %
HCT: 19.1 % — ABNORMAL LOW (ref 36.0–46.0)
Hemoglobin: 5 g/dL — CL (ref 12.0–15.0)
Immature Granulocytes: 0 %
Lymphocytes Relative: 29 %
Lymphs Abs: 1.3 10*3/uL (ref 0.7–4.0)
MCH: 16.7 pg — ABNORMAL LOW (ref 26.0–34.0)
MCHC: 26.2 g/dL — ABNORMAL LOW (ref 30.0–36.0)
MCV: 63.9 fL — ABNORMAL LOW (ref 80.0–100.0)
Monocytes Absolute: 0.4 10*3/uL (ref 0.1–1.0)
Monocytes Relative: 9 %
Neutro Abs: 2.6 10*3/uL (ref 1.7–7.7)
Neutrophils Relative %: 58 %
Platelet Count: 175 10*3/uL (ref 150–400)
RBC: 2.99 MIL/uL — ABNORMAL LOW (ref 3.87–5.11)
RDW: 20.1 % — ABNORMAL HIGH (ref 11.5–15.5)
WBC Count: 4.6 10*3/uL (ref 4.0–10.5)
nRBC: 0 % (ref 0.0–0.2)

## 2020-10-02 LAB — RETICULOCYTES
Immature Retic Fract: 15.2 % (ref 2.3–15.9)
RBC.: 2.97 MIL/uL — ABNORMAL LOW (ref 3.87–5.11)
Retic Count, Absolute: 45.1 10*3/uL (ref 19.0–186.0)
Retic Ct Pct: 1.5 % (ref 0.4–3.1)

## 2020-10-02 LAB — SAMPLE TO BLOOD BANK

## 2020-10-02 LAB — SAVE SMEAR(SSMR), FOR PROVIDER SLIDE REVIEW

## 2020-10-02 MED ORDER — CYANOCOBALAMIN 1000 MCG/ML IJ SOLN
INTRAMUSCULAR | Status: AC
  Filled 2020-10-02: qty 1

## 2020-10-02 MED ORDER — CYANOCOBALAMIN 1000 MCG/ML IJ SOLN
1000.0000 ug | Freq: Once | INTRAMUSCULAR | Status: AC
  Administered 2020-10-02: 1000 ug via INTRAMUSCULAR

## 2020-10-02 MED ORDER — NA FERRIC GLUC CPLX IN SUCROSE 12.5 MG/ML IV SOLN
125.0000 mg | Freq: Once | INTRAVENOUS | Status: AC
  Administered 2020-10-02: 125 mg via INTRAVENOUS
  Filled 2020-10-02: qty 10

## 2020-10-02 MED ORDER — SODIUM CHLORIDE 0.9 % IV SOLN
Freq: Once | INTRAVENOUS | Status: AC
  Filled 2020-10-02: qty 250

## 2020-10-02 MED ORDER — FOLIC ACID 1 MG PO TABS: 1.0000 mg | ORAL_TABLET | Freq: Every day | ORAL | 6 refills | Status: DC

## 2020-10-02 NOTE — Patient Instructions (Signed)
Sodium Ferric Gluconate Complex injection What is this medicine? SODIUM FERRIC GLUCONATE COMPLEX (SOE dee um FER ik GLOO koe nate KOM pleks) is an iron replacement. It is used with epoetin therapy to treat low iron levels in patients who are receiving hemodialysis. This medicine may be used for other purposes; ask your health care provider or pharmacist if you have questions. COMMON BRAND NAME(S): Ferrlecit, Nulecit What should I tell my health care provider before I take this medicine? They need to know if you have any of the following conditions:  anemia that is not from iron deficiency  high levels of iron in the body  an unusual or allergic reaction to iron, benzyl alcohol, other medicines, foods, dyes, or preservatives  pregnant or are trying to become pregnant  breast-feeding How should I use this medicine? This medicine is for infusion into a vein. It is given by a health care professional in a hospital or clinic setting. Talk to your pediatrician regarding the use of this medicine in children. While this drug may be prescribed for children as young as 74 years old for selected conditions, precautions do apply. Overdosage: If you think you have taken too much of this medicine contact a poison control center or emergency room at once. NOTE: This medicine is only for you. Do not share this medicine with others. What if I miss a dose? It is important not to miss your dose. Call your doctor or health care professional if you are unable to keep an appointment. What may interact with this medicine? Do not take this medicine with any of the following medications:  deferoxamine  dimercaprol  other iron products This medicine may also interact with the following medications:  chloramphenicol  deferasirox  medicine for blood pressure like enalapril This list may not describe all possible interactions. Give your health care provider a list of all the medicines, herbs,  non-prescription drugs, or dietary supplements you use. Also tell them if you smoke, drink alcohol, or use illegal drugs. Some items may interact with your medicine. What should I watch for while using this medicine? Your condition will be monitored carefully while you are receiving this medicine. Visit your doctor for check-ups as directed. What side effects may I notice from receiving this medicine? Side effects that you should report to your doctor or health care professional as soon as possible:  allergic reactions like skin rash, itching or hives, swelling of the face, lips, or tongue  breathing problems  changes in hearing  changes in vision  chills, flushing, or sweating  fast, irregular heartbeat  feeling faint or lightheaded, falls  fever, flu-like symptoms  high or low blood pressure  pain, tingling, numbness in the hands or feet  severe pain in the chest, back, flanks, or groin  swelling of the ankles, feet, hands  trouble passing urine or change in the amount of urine  unusually weak or tired Side effects that usually do not require medical attention (report to your doctor or health care professional if they continue or are bothersome):  cramps  dark colored stools  diarrhea  headache  nausea, vomiting  stomach upset This list may not describe all possible side effects. Call your doctor for medical advice about side effects. You may report side effects to FDA at 1-800-FDA-1088. Where should I keep my medicine? This drug is given in a hospital or clinic and will not be stored at home. NOTE: This sheet is a summary. It may not cover all  possible information. If you have questions about this medicine, talk to your doctor, pharmacist, or health care provider.  2021 Elsevier/Gold Standard (2008-03-20 15:58:57)  Cyanocobalamin, Vitamin B12 injection What is this medicine? CYANOCOBALAMIN (sye an oh koe BAL a min) is a man made form of vitamin B12. Vitamin  B12 is used in the growth of healthy blood cells, nerve cells, and proteins in the body. It also helps with the metabolism of fats and carbohydrates. This medicine is used to treat people who can not absorb vitamin B12. This medicine may be used for other purposes; ask your health care provider or pharmacist if you have questions. COMMON BRAND NAME(S): B-12 Compliance Kit, B-12 Injection Kit, Cyomin, LA-12, Nutri-Twelve, Physicians EZ Use B-12, Primabalt What should I tell my health care provider before I take this medicine? They need to know if you have any of these conditions:  kidney disease  Leber's disease  megaloblastic anemia  an unusual or allergic reaction to cyanocobalamin, cobalt, other medicines, foods, dyes, or preservatives  pregnant or trying to get pregnant  breast-feeding How should I use this medicine? This medicine is injected into a muscle or deeply under the skin. It is usually given by a health care professional in a clinic or doctor's office. However, your doctor may teach you how to inject yourself. Follow all instructions. Talk to your pediatrician regarding the use of this medicine in children. Special care may be needed. Overdosage: If you think you have taken too much of this medicine contact a poison control center or emergency room at once. NOTE: This medicine is only for you. Do not share this medicine with others. What if I miss a dose? If you are given your dose at a clinic or doctor's office, call to reschedule your appointment. If you give your own injections and you miss a dose, take it as soon as you can. If it is almost time for your next dose, take only that dose. Do not take double or extra doses. What may interact with this medicine?  colchicine  heavy alcohol intake This list may not describe all possible interactions. Give your health care provider a list of all the medicines, herbs, non-prescription drugs, or dietary supplements you use. Also  tell them if you smoke, drink alcohol, or use illegal drugs. Some items may interact with your medicine. What should I watch for while using this medicine? Visit your doctor or health care professional regularly. You may need blood work done while you are taking this medicine. You may need to follow a special diet. Talk to your doctor. Limit your alcohol intake and avoid smoking to get the best benefit. What side effects may I notice from receiving this medicine? Side effects that you should report to your doctor or health care professional as soon as possible:  allergic reactions like skin rash, itching or hives, swelling of the face, lips, or tongue  blue tint to skin  chest tightness, pain  difficulty breathing, wheezing  dizziness  red, swollen painful area on the leg Side effects that usually do not require medical attention (report to your doctor or health care professional if they continue or are bothersome):  diarrhea  headache This list may not describe all possible side effects. Call your doctor for medical advice about side effects. You may report side effects to FDA at 1-800-FDA-1088. Where should I keep my medicine? Keep out of the reach of children. Store at room temperature between 15 and 30 degrees C (59  and 85 degrees F). Protect from light. Throw away any unused medicine after the expiration date. NOTE: This sheet is a summary. It may not cover all possible information. If you have questions about this medicine, talk to your doctor, pharmacist, or health care provider.  2021 Elsevier/Gold Standard (2007-10-30 22:10:20)

## 2020-10-02 NOTE — Progress Notes (Signed)
Hematology and Oncology Follow Up Visit  Regina Mayo [id number] [birth year] 40 y.o. 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  She is symptomatic at this time with fatigue weakness, 2 episodes of chest discomfort last week and bruising easily.  She has a current Hgb of 5.0, MCV 63, platelets 175 and WBC count 4.6.  Amazingly enough she is still working full time as a Doctor, general practice all day and then going home to take care of her family. She denies feeling distressed at this time.  She states that for the last 2 months her cycle hasn't been as heavy as usual. She has not noted any other blood loss. No petechiae.  She has 2 daughters and no history of miscarriage.  She states that she is up to date on her mammogram and it was negative.  Her paternal aunt also has history of anemia.  No personal history of cancer. Family history includes: maternal aunt - uterine and maternal grandmother - breast.  She admits that she has not been sticking to her gluten free diet. She has a good appetite and but admits that she needs to hydrate better throughout the day. Her weight is stable at 161 lbs.  No fever, chills, n/v, cough, rash, dizziness, SOB, palpitations, abdominal pain or changes in bowel or bladder habits.  She has mild constipation at times.  No swelling, tenderness, numbness or tingling in her extremities.  No falls or syncope to report.   ECOG Performance Status: 1 - Symptomatic but completely ambulatory  Medications:  Allergies as of 10/02/2020   No Known Allergies     Medication List       Accurate as of October 02, 2020  2:38 PM. If you have any questions, ask your nurse or doctor.         b complex vitamins tablet Take 1 tablet by mouth daily.   EPINEPHrine 0.3 mg/0.3 mL Soaj injection Commonly known as: EPI-PEN Inject 0.3 mLs (0.3 mg total) into the muscle as needed for anaphylaxis.   fluticasone 50 MCG/ACT nasal spray Commonly known as: FLONASE   levothyroxine 88 MCG tablet Commonly known as: SYNTHROID 88 mcg daily before breakfast.   loratadine-pseudoephedrine 5-120 MG tablet Commonly known as: CLARITIN-D 12-hour Take 1 tablet by mouth 2 (two) times daily.   montelukast 10 MG tablet Commonly known as: SINGULAIR   traZODone 50 MG tablet Commonly known as: DESYREL   Trintellix 20 MG Tabs tablet Generic drug: vortioxetine HBr   vitamin B-12 100 MCG tablet Commonly known as: CYANOCOBALAMIN Take 100 mcg by mouth daily.   Vitamin D (Ergocalciferol) 1.25 MG (50000 UNIT) Caps capsule Commonly known as: DRISDOL Take 50,000 Units by mouth every 30 (thirty) days.       Allergies: No Known Allergies  Past Medical History, Surgical history, Social history, and Family History were reviewed and updated.  Review of Systems: All other 10 point review of systems is negative.   Physical Exam:  vitals were not taken for this visit.   Wt Readings from Last 3 Encounters:  04/13/20 166 lb (75.3 kg)  02/23/20 168 lb (76.2 kg)  02/08/17 167 lb (75.8 kg)    Ocular:  Sclerae unicteric, pupils equal, round and reactive to light Ear-nose-throat: Oropharynx clear, dentition fair Lymphatic: No cervical or supraclavicular adenopathy Lungs no rales or rhonchi, good excursion bilaterally Heart regular rate and rhythm, no murmur appreciated Abd soft, nontender, positive bowel sounds MSK no focal spinal tenderness, no joint edema Neuro: non-focal, well-oriented, appropriate affect Breasts: Deferred   Lab Results  Component Value Date   WBC 4.6 10/02/2020   HGB 5.0 (LL) 10/02/2020   HCT 19.1 (L) 10/02/2020   MCV 63.9 (L) 10/02/2020   PLT 175 10/02/2020   Lab  Results  Component Value Date   FERRITIN 45 02/08/2017   IRON 117 02/08/2017   TIBC 295 02/08/2017   UIBC 178 02/08/2017   IRONPCTSAT 40 02/08/2017   Lab Results  Component Value Date   RETICCTPCT 1.5 10/02/2020   RBC 2.99 (L) 10/02/2020   RBC 2.97 (L) 10/02/2020   RETICCTABS 58.1 08/08/2015   No results found for: KPAFRELGTCHN, LAMBDASER, KAPLAMBRATIO No results found for: IGGSERUM, IGA, IGMSERUM No results found for: Ronnald Ramp, A1GS, A2GS, Tillman Sers, SPEI   Chemistry      Component Value Date/Time   NA 138 08/08/2015 1214   K 4.1 08/08/2015 1214   CL 101 01/22/2015 1412   CO2 26 08/08/2015 1214   BUN 17.2 08/08/2015 1214   CREATININE 0.8 08/08/2015 1214      Component Value Date/Time   CALCIUM 9.1 08/08/2015 1214   ALKPHOS 78 08/08/2015 1214   AST 21 08/08/2015 1214   ALT 15 08/08/2015 1214   BILITOT <0.30 08/08/2015 1214       Impression and Plan: Regina Mayo is a very pleasant 40 yo [name] with iron deficiency anemia secondary to menorrhagia and malabsorption with celiac disease. She is here today symptomatic as mentioned above with Hgb of 5.0.  B 12 was also low at 128.  We will giver her 2 doses of Ferrlecit.  She will also start weekly B 12 injections.  She will start taking folic acid daily. Script sent to [facility].  Follow-up in 4 weeks to re-evaluate lab work and symptoms.  She is in agreement with the plan and will contact our office with any questions or concerns. She was advised to go the ED in the event of an emergency and verbalized understanding.   Laverna Peace, NP 3/3/20222:38 PM

## 2020-10-03 ENCOUNTER — Telehealth: Payer: Self-pay | Admitting: *Deleted

## 2020-10-03 LAB — IRON AND TIBC
Iron: 8 ug/dL — ABNORMAL LOW (ref 41–142)
Saturation Ratios: 2 % — ABNORMAL LOW (ref 21–57)
TIBC: 506 ug/dL — ABNORMAL HIGH (ref 236–444)
UIBC: 498 ug/dL — ABNORMAL HIGH (ref 120–384)

## 2020-10-03 LAB — FERRITIN: Ferritin: <4 ng/mL — ABNORMAL LOW (ref 11–307)

## 2020-10-03 NOTE — Telephone Encounter (Signed)
Per los 3/3 lvm of upcoming appts - mailed calendar

## 2020-10-10 ENCOUNTER — Inpatient Hospital Stay: Payer: BC Managed Care – PPO

## 2020-10-10 ENCOUNTER — Other Ambulatory Visit: Payer: Self-pay

## 2020-10-10 VITALS — BP 117/74 | HR 68 | Temp 97.6°F | Resp 16

## 2020-10-10 DIAGNOSIS — D500 Iron deficiency anemia secondary to blood loss (chronic): Secondary | ICD-10-CM | POA: Diagnosis not present

## 2020-10-10 DIAGNOSIS — N921 Excessive and frequent menstruation with irregular cycle: Secondary | ICD-10-CM

## 2020-10-10 DIAGNOSIS — D508 Other iron deficiency anemias: Secondary | ICD-10-CM

## 2020-10-10 DIAGNOSIS — K900 Celiac disease: Secondary | ICD-10-CM

## 2020-10-10 DIAGNOSIS — E538 Deficiency of other specified B group vitamins: Secondary | ICD-10-CM | POA: Diagnosis not present

## 2020-10-10 DIAGNOSIS — K909 Intestinal malabsorption, unspecified: Secondary | ICD-10-CM

## 2020-10-10 DIAGNOSIS — N920 Excessive and frequent menstruation with regular cycle: Secondary | ICD-10-CM | POA: Diagnosis not present

## 2020-10-10 MED ORDER — SODIUM CHLORIDE 0.9 % IV SOLN
125.0000 mg | Freq: Once | INTRAVENOUS | Status: AC
  Administered 2020-10-10: 125 mg via INTRAVENOUS
  Filled 2020-10-10: qty 10

## 2020-10-10 MED ORDER — CYANOCOBALAMIN 1000 MCG/ML IJ SOLN
INTRAMUSCULAR | Status: AC
  Filled 2020-10-10: qty 1

## 2020-10-10 MED ORDER — CYANOCOBALAMIN 1000 MCG/ML IJ SOLN
1000.0000 ug | Freq: Once | INTRAMUSCULAR | Status: AC
  Administered 2020-10-10: 1000 ug via INTRAMUSCULAR

## 2020-10-10 MED ORDER — SODIUM CHLORIDE 0.9 % IV SOLN
Freq: Once | INTRAVENOUS | Status: AC
  Filled 2020-10-10: qty 250

## 2020-10-10 NOTE — Patient Instructions (Addendum)
Sodium Ferric Gluconate Complex injection What is this medicine? SODIUM FERRIC GLUCONATE COMPLEX (SOE dee um FER ik GLOO koe nate KOM pleks) is an iron replacement. It is used with epoetin therapy to treat low iron levels in patients who are receiving hemodialysis. This medicine may be used for other purposes; ask your health care provider or pharmacist if you have questions. COMMON BRAND NAME(S): Ferrlecit, Nulecit What should I tell my health care provider before I take this medicine? They need to know if you have any of the following conditions:  anemia that is not from iron deficiency  high levels of iron in the body  an unusual or allergic reaction to iron, benzyl alcohol, other medicines, foods, dyes, or preservatives  pregnant or are trying to become pregnant  breast-feeding How should I use this medicine? This medicine is for infusion into a vein. It is given by a health care professional in a hospital or clinic setting. Talk to your pediatrician regarding the use of this medicine in children. While this drug may be prescribed for children as young as 74 years old for selected conditions, precautions do apply. Overdosage: If you think you have taken too much of this medicine contact a poison control center or emergency room at once. NOTE: This medicine is only for you. Do not share this medicine with others. What if I miss a dose? It is important not to miss your dose. Call your doctor or health care professional if you are unable to keep an appointment. What may interact with this medicine? Do not take this medicine with any of the following medications:  deferoxamine  dimercaprol  other iron products This medicine may also interact with the following medications:  chloramphenicol  deferasirox  medicine for blood pressure like enalapril This list may not describe all possible interactions. Give your health care provider a list of all the medicines, herbs,  non-prescription drugs, or dietary supplements you use. Also tell them if you smoke, drink alcohol, or use illegal drugs. Some items may interact with your medicine. What should I watch for while using this medicine? Your condition will be monitored carefully while you are receiving this medicine. Visit your doctor for check-ups as directed. What side effects may I notice from receiving this medicine? Side effects that you should report to your doctor or health care professional as soon as possible:  allergic reactions like skin rash, itching or hives, swelling of the face, lips, or tongue  breathing problems  changes in hearing  changes in vision  chills, flushing, or sweating  fast, irregular heartbeat  feeling faint or lightheaded, falls  fever, flu-like symptoms  high or low blood pressure  pain, tingling, numbness in the hands or feet  severe pain in the chest, back, flanks, or groin  swelling of the ankles, feet, hands  trouble passing urine or change in the amount of urine  unusually weak or tired Side effects that usually do not require medical attention (report to your doctor or health care professional if they continue or are bothersome):  cramps  dark colored stools  diarrhea  headache  nausea, vomiting  stomach upset This list may not describe all possible side effects. Call your doctor for medical advice about side effects. You may report side effects to FDA at 1-800-FDA-1088. Where should I keep my medicine? This drug is given in a hospital or clinic and will not be stored at home. NOTE: This sheet is a summary. It may not cover all  possible information. If you have questions about this medicine, talk to your doctor, pharmacist, or health care provider.  2021 Elsevier/Gold Standard (2008-03-20 15:58:57)  Cyanocobalamin, Vitamin B12 injection What is this medicine? CYANOCOBALAMIN (sye an oh koe BAL a min) is a man made form of vitamin B12. Vitamin  B12 is used in the growth of healthy blood cells, nerve cells, and proteins in the body. It also helps with the metabolism of fats and carbohydrates. This medicine is used to treat people who can not absorb vitamin B12. This medicine may be used for other purposes; ask your health care provider or pharmacist if you have questions. COMMON BRAND NAME(S): B-12 Compliance Kit, B-12 Injection Kit, Cyomin, LA-12, Nutri-Twelve, Physicians EZ Use B-12, Primabalt What should I tell my health care provider before I take this medicine? They need to know if you have any of these conditions:  kidney disease  Leber's disease  megaloblastic anemia  an unusual or allergic reaction to cyanocobalamin, cobalt, other medicines, foods, dyes, or preservatives  pregnant or trying to get pregnant  breast-feeding How should I use this medicine? This medicine is injected into a muscle or deeply under the skin. It is usually given by a health care professional in a clinic or doctor's office. However, your doctor may teach you how to inject yourself. Follow all instructions. Talk to your pediatrician regarding the use of this medicine in children. Special care may be needed. Overdosage: If you think you have taken too much of this medicine contact a poison control center or emergency room at once. NOTE: This medicine is only for you. Do not share this medicine with others. What if I miss a dose? If you are given your dose at a clinic or doctor's office, call to reschedule your appointment. If you give your own injections and you miss a dose, take it as soon as you can. If it is almost time for your next dose, take only that dose. Do not take double or extra doses. What may interact with this medicine?  colchicine  heavy alcohol intake This list may not describe all possible interactions. Give your health care provider a list of all the medicines, herbs, non-prescription drugs, or dietary supplements you use. Also  tell them if you smoke, drink alcohol, or use illegal drugs. Some items may interact with your medicine. What should I watch for while using this medicine? Visit your doctor or health care professional regularly. You may need blood work done while you are taking this medicine. You may need to follow a special diet. Talk to your doctor. Limit your alcohol intake and avoid smoking to get the best benefit. What side effects may I notice from receiving this medicine? Side effects that you should report to your doctor or health care professional as soon as possible:  allergic reactions like skin rash, itching or hives, swelling of the face, lips, or tongue  blue tint to skin  chest tightness, pain  difficulty breathing, wheezing  dizziness  red, swollen painful area on the leg Side effects that usually do not require medical attention (report to your doctor or health care professional if they continue or are bothersome):  diarrhea  headache This list may not describe all possible side effects. Call your doctor for medical advice about side effects. You may report side effects to FDA at 1-800-FDA-1088. Where should I keep my medicine? Keep out of the reach of children. Store at room temperature between 15 and 30 degrees C (59   and 85 degrees F). Protect from light. Throw away any unused medicine after the expiration date. NOTE: This sheet is a summary. It may not cover all possible information. If you have questions about this medicine, talk to your doctor, pharmacist, or health care provider.  2021 Elsevier/Gold Standard (2007-10-30 22:10:20)   

## 2020-10-10 NOTE — Patient Instructions (Signed)

## 2020-10-17 ENCOUNTER — Inpatient Hospital Stay: Payer: BC Managed Care – PPO

## 2020-10-17 ENCOUNTER — Other Ambulatory Visit: Payer: Self-pay

## 2020-10-17 VITALS — BP 114/65 | HR 75 | Temp 99.1°F | Resp 17

## 2020-10-17 DIAGNOSIS — D500 Iron deficiency anemia secondary to blood loss (chronic): Secondary | ICD-10-CM | POA: Diagnosis not present

## 2020-10-17 DIAGNOSIS — K900 Celiac disease: Secondary | ICD-10-CM

## 2020-10-17 DIAGNOSIS — N920 Excessive and frequent menstruation with regular cycle: Secondary | ICD-10-CM | POA: Diagnosis not present

## 2020-10-17 DIAGNOSIS — K909 Intestinal malabsorption, unspecified: Secondary | ICD-10-CM

## 2020-10-17 DIAGNOSIS — E538 Deficiency of other specified B group vitamins: Secondary | ICD-10-CM | POA: Diagnosis not present

## 2020-10-17 DIAGNOSIS — D508 Other iron deficiency anemias: Secondary | ICD-10-CM

## 2020-10-17 DIAGNOSIS — N921 Excessive and frequent menstruation with irregular cycle: Secondary | ICD-10-CM

## 2020-10-17 MED ORDER — CYANOCOBALAMIN 1000 MCG/ML IJ SOLN
1000.0000 ug | Freq: Once | INTRAMUSCULAR | Status: AC
  Administered 2020-10-17: 1000 ug via INTRAMUSCULAR

## 2020-10-17 MED ORDER — CYANOCOBALAMIN 1000 MCG/ML IJ SOLN
INTRAMUSCULAR | Status: AC
  Filled 2020-10-17: qty 1

## 2020-10-17 MED ORDER — SODIUM CHLORIDE 0.9% FLUSH
3.0000 mL | Freq: Once | INTRAVENOUS | Status: DC | PRN
  Filled 2020-10-17: qty 10

## 2020-10-17 MED ORDER — SODIUM CHLORIDE 0.9% FLUSH
10.0000 mL | Freq: Once | INTRAVENOUS | Status: DC | PRN
  Filled 2020-10-17: qty 10

## 2020-10-17 NOTE — Patient Instructions (Signed)

## 2020-10-24 ENCOUNTER — Other Ambulatory Visit: Payer: Self-pay

## 2020-10-24 ENCOUNTER — Inpatient Hospital Stay: Payer: BC Managed Care – PPO

## 2020-10-24 VITALS — BP 118/63 | HR 74 | Temp 98.6°F | Resp 18

## 2020-10-24 DIAGNOSIS — E538 Deficiency of other specified B group vitamins: Secondary | ICD-10-CM | POA: Diagnosis not present

## 2020-10-24 DIAGNOSIS — D500 Iron deficiency anemia secondary to blood loss (chronic): Secondary | ICD-10-CM | POA: Diagnosis not present

## 2020-10-24 DIAGNOSIS — N920 Excessive and frequent menstruation with regular cycle: Secondary | ICD-10-CM | POA: Diagnosis not present

## 2020-10-24 DIAGNOSIS — K900 Celiac disease: Secondary | ICD-10-CM

## 2020-10-24 DIAGNOSIS — D508 Other iron deficiency anemias: Secondary | ICD-10-CM

## 2020-10-24 DIAGNOSIS — K909 Intestinal malabsorption, unspecified: Secondary | ICD-10-CM

## 2020-10-24 DIAGNOSIS — N921 Excessive and frequent menstruation with irregular cycle: Secondary | ICD-10-CM

## 2020-10-24 MED ORDER — CYANOCOBALAMIN 1000 MCG/ML IJ SOLN
INTRAMUSCULAR | Status: AC
  Filled 2020-10-24: qty 1

## 2020-10-24 MED ORDER — CYANOCOBALAMIN 1000 MCG/ML IJ SOLN
1000.0000 ug | Freq: Once | INTRAMUSCULAR | Status: AC
  Administered 2020-10-24: 1000 ug via INTRAMUSCULAR

## 2020-10-24 NOTE — Patient Instructions (Signed)

## 2020-11-03 ENCOUNTER — Inpatient Hospital Stay (HOSPITAL_BASED_OUTPATIENT_CLINIC_OR_DEPARTMENT_OTHER): Payer: BC Managed Care – PPO | Admitting: Family

## 2020-11-03 ENCOUNTER — Inpatient Hospital Stay: Payer: BC Managed Care – PPO

## 2020-11-03 ENCOUNTER — Other Ambulatory Visit: Payer: Self-pay

## 2020-11-03 ENCOUNTER — Ambulatory Visit: Payer: BC Managed Care – PPO

## 2020-11-03 ENCOUNTER — Encounter: Payer: Self-pay | Admitting: Family

## 2020-11-03 ENCOUNTER — Telehealth: Payer: Self-pay | Admitting: Family

## 2020-11-03 ENCOUNTER — Inpatient Hospital Stay: Payer: BC Managed Care – PPO | Attending: Family

## 2020-11-03 VITALS — BP 112/72 | HR 68

## 2020-11-03 VITALS — BP 101/59 | HR 80 | Temp 98.1°F | Resp 18 | Ht 66.0 in | Wt 159.1 lb

## 2020-11-03 DIAGNOSIS — D569 Thalassemia, unspecified: Secondary | ICD-10-CM | POA: Diagnosis not present

## 2020-11-03 DIAGNOSIS — D649 Anemia, unspecified: Secondary | ICD-10-CM | POA: Diagnosis not present

## 2020-11-03 DIAGNOSIS — D500 Iron deficiency anemia secondary to blood loss (chronic): Secondary | ICD-10-CM

## 2020-11-03 DIAGNOSIS — N920 Excessive and frequent menstruation with regular cycle: Secondary | ICD-10-CM | POA: Insufficient documentation

## 2020-11-03 DIAGNOSIS — E538 Deficiency of other specified B group vitamins: Secondary | ICD-10-CM

## 2020-11-03 DIAGNOSIS — D508 Other iron deficiency anemias: Secondary | ICD-10-CM | POA: Diagnosis not present

## 2020-11-03 DIAGNOSIS — K909 Intestinal malabsorption, unspecified: Secondary | ICD-10-CM

## 2020-11-03 DIAGNOSIS — N921 Excessive and frequent menstruation with irregular cycle: Secondary | ICD-10-CM

## 2020-11-03 DIAGNOSIS — K900 Celiac disease: Secondary | ICD-10-CM

## 2020-11-03 LAB — CBC WITH DIFFERENTIAL (CANCER CENTER ONLY)
Abs Immature Granulocytes: 0.01 10*3/uL (ref 0.00–0.07)
Basophils Absolute: 0.1 10*3/uL (ref 0.0–0.1)
Basophils Relative: 1 %
Eosinophils Absolute: 0.2 10*3/uL (ref 0.0–0.5)
Eosinophils Relative: 5 %
HCT: 22.3 % — ABNORMAL LOW (ref 36.0–46.0)
Hemoglobin: 6 g/dL — CL (ref 12.0–15.0)
Immature Granulocytes: 0 %
Lymphocytes Relative: 24 %
Lymphs Abs: 0.9 10*3/uL (ref 0.7–4.0)
MCH: 17.4 pg — ABNORMAL LOW (ref 26.0–34.0)
MCHC: 26.9 g/dL — ABNORMAL LOW (ref 30.0–36.0)
MCV: 64.8 fL — ABNORMAL LOW (ref 80.0–100.0)
Monocytes Absolute: 0.3 10*3/uL (ref 0.1–1.0)
Monocytes Relative: 9 %
Neutro Abs: 2.2 10*3/uL (ref 1.7–7.7)
Neutrophils Relative %: 61 %
Platelet Count: 141 10*3/uL — ABNORMAL LOW (ref 150–400)
RBC: 3.44 MIL/uL — ABNORMAL LOW (ref 3.87–5.11)
RDW: 22.9 % — ABNORMAL HIGH (ref 11.5–15.5)
WBC Count: 3.7 10*3/uL — ABNORMAL LOW (ref 4.0–10.5)
nRBC: 0 % (ref 0.0–0.2)

## 2020-11-03 LAB — RETICULOCYTES
Immature Retic Fract: 15.2 % (ref 2.3–15.9)
RBC.: 3.36 MIL/uL — ABNORMAL LOW (ref 3.87–5.11)
Retic Count, Absolute: 39.6 10*3/uL (ref 19.0–186.0)
Retic Ct Pct: 1.2 % (ref 0.4–3.1)

## 2020-11-03 LAB — CMP (CANCER CENTER ONLY)
ALT: 12 U/L (ref 0–44)
AST: 12 U/L — ABNORMAL LOW (ref 15–41)
Albumin: 3.9 g/dL (ref 3.5–5.0)
Alkaline Phosphatase: 63 U/L (ref 38–126)
Anion gap: 7 (ref 5–15)
BUN: 12 mg/dL (ref 6–20)
CO2: 26 mmol/L (ref 22–32)
Calcium: 9 mg/dL (ref 8.9–10.3)
Chloride: 107 mmol/L (ref 98–111)
Creatinine: 0.83 mg/dL (ref 0.44–1.00)
GFR, Estimated: >60 mL/min (ref >60)
Glucose, Bld: 112 mg/dL — ABNORMAL HIGH (ref 70–99)
Potassium: 3.9 mmol/L (ref 3.5–5.1)
Sodium: 140 mmol/L (ref 135–145)
Total Bilirubin: 0.4 mg/dL (ref 0.3–1.2)
Total Protein: 6.4 g/dL — ABNORMAL LOW (ref 6.5–8.1)

## 2020-11-03 LAB — VITAMIN B12: Vitamin B-12: 343 pg/mL (ref 180–914)

## 2020-11-03 LAB — FERRITIN: Ferritin: <4 ng/mL — ABNORMAL LOW (ref 11–307)

## 2020-11-03 LAB — SAVE SMEAR(SSMR), FOR PROVIDER SLIDE REVIEW

## 2020-11-03 LAB — IRON AND TIBC
Iron: 20 ug/dL — ABNORMAL LOW (ref 41–142)
Saturation Ratios: 4 % — ABNORMAL LOW (ref 21–57)
TIBC: 455 ug/dL — ABNORMAL HIGH (ref 236–444)
UIBC: 435 ug/dL — ABNORMAL HIGH (ref 120–384)

## 2020-11-03 MED ORDER — SODIUM CHLORIDE 0.9 % IV SOLN
125.0000 mg | Freq: Once | INTRAVENOUS | Status: AC
  Administered 2020-11-03: 125 mg via INTRAVENOUS
  Filled 2020-11-03: qty 10

## 2020-11-03 MED ORDER — CYANOCOBALAMIN 1000 MCG/ML IJ SOLN
INTRAMUSCULAR | Status: AC
  Filled 2020-11-03: qty 1

## 2020-11-03 MED ORDER — SODIUM CHLORIDE 0.9 % IV SOLN
Freq: Once | INTRAVENOUS | Status: AC
  Filled 2020-11-03: qty 250

## 2020-11-03 MED ORDER — CYANOCOBALAMIN 1000 MCG/ML IJ SOLN
1000.0000 ug | Freq: Once | INTRAMUSCULAR | Status: AC
  Administered 2020-11-03: 1000 ug via INTRAMUSCULAR

## 2020-11-03 NOTE — Patient Instructions (Signed)
Sodium Ferric Gluconate Complex injection What is this medicine? SODIUM FERRIC GLUCONATE COMPLEX (SOE dee um FER ik GLOO koe nate KOM pleks) is an iron replacement. It is used with epoetin therapy to treat low iron levels in patients who are receiving hemodialysis. This medicine may be used for other purposes; ask your health care provider or pharmacist if you have questions. COMMON BRAND NAME(S): Ferrlecit, Nulecit What should I tell my health care provider before I take this medicine? They need to know if you have any of the following conditions:  anemia that is not from iron deficiency  high levels of iron in the body  an unusual or allergic reaction to iron, benzyl alcohol, other medicines, foods, dyes, or preservatives  pregnant or are trying to become pregnant  breast-feeding How should I use this medicine? This medicine is for infusion into a vein. It is given by a health care professional in a hospital or clinic setting. Talk to your pediatrician regarding the use of this medicine in children. While this drug may be prescribed for children as young as 6 years old for selected conditions, precautions do apply. Overdosage: If you think you have taken too much of this medicine contact a poison control center or emergency room at once. NOTE: This medicine is only for you. Do not share this medicine with others. What if I miss a dose? It is important not to miss your dose. Call your doctor or health care professional if you are unable to keep an appointment. What may interact with this medicine? Do not take this medicine with any of the following medications:  deferoxamine  dimercaprol  other iron products This medicine may also interact with the following medications:  chloramphenicol  deferasirox  medicine for blood pressure like enalapril This list may not describe all possible interactions. Give your health care provider a list of all the medicines, herbs,  non-prescription drugs, or dietary supplements you use. Also tell them if you smoke, drink alcohol, or use illegal drugs. Some items may interact with your medicine. What should I watch for while using this medicine? Your condition will be monitored carefully while you are receiving this medicine. Visit your doctor for check-ups as directed. What side effects may I notice from receiving this medicine? Side effects that you should report to your doctor or health care professional as soon as possible:  allergic reactions like skin rash, itching or hives, swelling of the face, lips, or tongue  breathing problems  changes in hearing  changes in vision  chills, flushing, or sweating  fast, irregular heartbeat  feeling faint or lightheaded, falls  fever, flu-like symptoms  high or low blood pressure  pain, tingling, numbness in the hands or feet  severe pain in the chest, back, flanks, or groin  swelling of the ankles, feet, hands  trouble passing urine or change in the amount of urine  unusually weak or tired Side effects that usually do not require medical attention (report to your doctor or health care professional if they continue or are bothersome):  cramps  dark colored stools  diarrhea  headache  nausea, vomiting  stomach upset This list may not describe all possible side effects. Call your doctor for medical advice about side effects. You may report side effects to FDA at 1-800-FDA-1088. Where should I keep my medicine? This drug is given in a hospital or clinic and will not be stored at home. NOTE: This sheet is a summary. It may not cover all   possible information. If you have questions about this medicine, talk to your doctor, pharmacist, or health care provider.  2021 Elsevier/Gold Standard (2008-03-20 15:58:57)   

## 2020-11-03 NOTE — Telephone Encounter (Signed)
Appointments scheduled calendar printed & mailed per 4/4 los 

## 2020-11-03 NOTE — Progress Notes (Signed)
Hematology and Oncology Follow Up Visit  Regina Mayo [id number] 1980/10/29 40 y.o. 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 She is feeling a little better but still notes fatigue and weakness in her legs with over exertion such as stairs.  Hgb today is 6.0, MCV 64, WBC count is 3.7 and platelets 141.  She states that her cycle was normal last month and that her flow was not particularly heavy. No other blood loss noted.  No abnormal bruising, no petechiae.  No fever, chills, n/v, cough, rash, dizziness, SOB, chest pain, palpitations, abdominal pain or changes in bowel or bladder habits.  No swelling, tenderness, numbness or tingling in her extremities. She has occasional stiffness in her feet.  No falls or syncope to report.  She has maintained a good appetite but states that she needs to better hydrate throughout the day. Her weight is stable at 159 lbs.   ECOG Performance Status: 1 - Symptomatic but completely ambulatory  Medications:  Allergies as of 11/03/2020   No Known Allergies     Medication List       Accurate as of November 03, 2020  9:22 AM. If you have any questions, ask your nurse or doctor.        b complex vitamins tablet Take 1 tablet by mouth daily.   COVID-19 Specimen Collection Kit TEST AS DIRECTED TODAY   EPINEPHrine 0.3 mg/0.3 mL Soaj injection Commonly known as: EPI-PEN Inject 0.3 mLs (0.3 mg total) into the muscle as needed for anaphylaxis.   fluticasone 50 MCG/ACT nasal spray Commonly known as: FLONASE   folic acid 1 MG tablet Commonly known as: FOLVITE Take 1 tablet (1 mg total) by mouth daily.   levothyroxine 88 MCG tablet Commonly known as: SYNTHROID 88 mcg daily before breakfast.    LEVOTHYROXINE SODIUM PO   levothyroxine 50 MCG tablet Commonly known as: SYNTHROID   loratadine-pseudoephedrine 5-120 MG tablet Commonly known as: CLARITIN-D 12-hour Take 1 tablet by mouth 2 (two) times daily.   montelukast 10 MG tablet Commonly known as: SINGULAIR   traZODone 50 MG tablet Commonly known as: DESYREL   TRINTELLIX PO   Trintellix 20 MG Tabs tablet Generic drug: vortioxetine HBr   vitamin B-12 100 MCG tablet Commonly known as: CYANOCOBALAMIN Take 100 mcg by mouth daily.   VITAMIN B-12 PO   Vitamin D (Ergocalciferol) 1.25 MG (50000 UNIT) Caps capsule Commonly known as: DRISDOL Take 50,000 Units by mouth every 30 (thirty) days.   VITAMIN D3 PO       Allergies: No Known Allergies  Past Medical History, Surgical history, Social history, and Family History were reviewed and updated.  Review of Systems: All other 10 point review of systems is negative.   Physical Exam:  vitals were not taken for this visit.   Wt Readings from Last 3 Encounters:  10/02/20 161 lb (73 kg)  04/13/20 166 lb (75.3 kg)  02/23/20 168 lb (76.2 kg)    Ocular: Sclerae unicteric, pupils equal, round and reactive to light Ear-nose-throat: Oropharynx clear, dentition fair Lymphatic: No cervical or supraclavicular adenopathy Lungs no rales or rhonchi, good excursion bilaterally Heart regular rate and rhythm, no murmur appreciated Abd soft, nontender, positive bowel sounds MSK no focal spinal tenderness, no joint  edema Neuro: non-focal, well-oriented, appropriate affect Breasts: Deferred   Lab Results  Component Value Date   WBC 4.6 10/02/2020   HGB 5.0 (LL) 10/02/2020   HCT 19.1 (L) 10/02/2020   MCV 63.9 (L) 10/02/2020   PLT 175 10/02/2020   Lab Results  Component Value Date   FERRITIN <4 (L) 10/02/2020   IRON 8 (L) 10/02/2020   TIBC 506 (H) 10/02/2020   UIBC 498 (H) 10/02/2020   IRONPCTSAT 2 (L) 10/02/2020   Lab Results  Component Value Date   RETICCTPCT 1.5  10/02/2020   RBC 2.99 (L) 10/02/2020   RBC 2.97 (L) 10/02/2020   RETICCTABS 58.1 08/08/2015   No results found for: KPAFRELGTCHN, LAMBDASER, KAPLAMBRATIO No results found for: IGGSERUM, IGA, IGMSERUM No results found for: Ronnald Ramp, A1GS, A2GS, Violet Baldy, MSPIKE, SPEI   Chemistry      Component Value Date/Time   NA 138 08/08/2015 1214   K 4.1 08/08/2015 1214   CL 101 01/22/2015 1412   CO2 26 08/08/2015 1214   BUN 17.2 08/08/2015 1214   CREATININE 0.8 08/08/2015 1214      Component Value Date/Time   CALCIUM 9.1 08/08/2015 1214   ALKPHOS 78 08/08/2015 1214   AST 21 08/08/2015 1214   ALT 15 08/08/2015 1214   BILITOT <0.30 08/08/2015 1214       Impression and Plan: Regina Mayo is a very pleasant 40 yo [name] with iron deficiency anemia secondary to menorrhagia and malabsorption with celiac disease. We will get an alpha thalassemia and Hgb fractionation cascade on her today. She is currently taking 1 mg PO daily of folic acid.  She will receive IV iron today as well as B 12 injection as planned.  Blood smear reviewed by Dr. Marin Olp. Some target cells seen, no evidence of malignancy noted.  She will get another dose of IV iron in 2 weeks. She has to put in her time off 2 weeks in advance.  Follow-up in 1 month.  She can contact our office with any questions or concerns.   Laverna Peace, NP 4/4/20229:22 AM

## 2020-11-05 LAB — HGB FRACTIONATION CASCADE
Hgb A2: 2.2 % (ref 1.8–3.2)
Hgb A: 97.8 % (ref 96.4–98.8)
Hgb F: 0 % (ref 0.0–2.0)
Hgb S: 0 %

## 2020-11-12 LAB — ALPHA-THALASSEMIA GENOTYPR

## 2020-11-17 ENCOUNTER — Telehealth: Payer: Self-pay | Admitting: *Deleted

## 2020-11-17 ENCOUNTER — Inpatient Hospital Stay: Payer: BC Managed Care – PPO

## 2020-11-17 ENCOUNTER — Telehealth: Payer: Self-pay

## 2020-11-17 NOTE — Telephone Encounter (Signed)
-----   Message from Eliezer Bottom, NP sent at 11/14/2020  4:33 PM EDT ----- Alpha thalassemia and Hgb fractionation cascade negative!!! No thalassemia anemia!!! WOO HOO!!! Thank you :)   ----- Message ----- From: Interface, [facility] [place] Sent: 11/03/2020   9:24 AM EDT To: Eliezer Bottom, NP

## 2020-11-17 NOTE — Telephone Encounter (Signed)
Pt  Called and left a vm to r/s her 4/29 iron to earlier in the week, I have moved her appt to 4/26 and left a vm with updated appt   Regina Mayo

## 2020-11-17 NOTE — Telephone Encounter (Signed)
As noted below by Laverna Peace, NP, I informed the patient that the Alpha Thalassemia and Hemoglobin fractionation cascade is negative. So, you do not have Thalassemia anemia. She verbalized understanding.

## 2020-11-18 ENCOUNTER — Telehealth: Payer: Self-pay

## 2020-11-18 NOTE — Telephone Encounter (Signed)
Pt called and r/s her iron tx and now will be here 11/26/2020   Webb Silversmith

## 2020-11-25 ENCOUNTER — Ambulatory Visit: Payer: BC Managed Care – PPO

## 2020-11-26 ENCOUNTER — Inpatient Hospital Stay: Payer: BC Managed Care – PPO

## 2020-11-26 ENCOUNTER — Other Ambulatory Visit: Payer: Self-pay

## 2020-11-26 VITALS — BP 121/57 | HR 90 | Temp 98.1°F | Resp 18

## 2020-11-26 DIAGNOSIS — K900 Celiac disease: Secondary | ICD-10-CM

## 2020-11-26 DIAGNOSIS — D500 Iron deficiency anemia secondary to blood loss (chronic): Secondary | ICD-10-CM | POA: Diagnosis not present

## 2020-11-26 DIAGNOSIS — K909 Intestinal malabsorption, unspecified: Secondary | ICD-10-CM

## 2020-11-26 DIAGNOSIS — N921 Excessive and frequent menstruation with irregular cycle: Secondary | ICD-10-CM

## 2020-11-26 DIAGNOSIS — D508 Other iron deficiency anemias: Secondary | ICD-10-CM

## 2020-11-26 DIAGNOSIS — N920 Excessive and frequent menstruation with regular cycle: Secondary | ICD-10-CM | POA: Diagnosis not present

## 2020-11-26 DIAGNOSIS — E538 Deficiency of other specified B group vitamins: Secondary | ICD-10-CM | POA: Diagnosis not present

## 2020-11-26 MED ORDER — SODIUM CHLORIDE 0.9 % IV SOLN
Freq: Once | INTRAVENOUS | Status: AC
Start: 2020-11-26 — End: 2020-11-26
  Filled 2020-11-26: qty 250

## 2020-11-26 MED ORDER — SODIUM CHLORIDE 0.9 % IV SOLN
125.0000 mg | Freq: Once | INTRAVENOUS | Status: AC
  Administered 2020-11-26: 125 mg via INTRAVENOUS
  Filled 2020-11-26: qty 10

## 2020-11-26 NOTE — Patient Instructions (Signed)
Sodium Ferric Gluconate Complex injection What is this medicine? SODIUM FERRIC GLUCONATE COMPLEX (SOE dee um FER ik GLOO koe nate KOM pleks) is an iron replacement. It is used with epoetin therapy to treat low iron levels in patients who are receiving hemodialysis. This medicine may be used for other purposes; ask your health care provider or pharmacist if you have questions. COMMON BRAND NAME(S): Ferrlecit, Nulecit What should I tell my health care provider before I take this medicine? They need to know if you have any of the following conditions:  anemia that is not from iron deficiency  high levels of iron in the body  an unusual or allergic reaction to iron, benzyl alcohol, other medicines, foods, dyes, or preservatives  pregnant or are trying to become pregnant  breast-feeding How should I use this medicine? This medicine is for infusion into a vein. It is given by a health care professional in a hospital or clinic setting. Talk to your pediatrician regarding the use of this medicine in children. While this drug may be prescribed for children as young as 6 years old for selected conditions, precautions do apply. Overdosage: If you think you have taken too much of this medicine contact a poison control center or emergency room at once. NOTE: This medicine is only for you. Do not share this medicine with others. What if I miss a dose? It is important not to miss your dose. Call your doctor or health care professional if you are unable to keep an appointment. What may interact with this medicine? Do not take this medicine with any of the following medications:  deferoxamine  dimercaprol  other iron products This medicine may also interact with the following medications:  chloramphenicol  deferasirox  medicine for blood pressure like enalapril This list may not describe all possible interactions. Give your health care provider a list of all the medicines, herbs,  non-prescription drugs, or dietary supplements you use. Also tell them if you smoke, drink alcohol, or use illegal drugs. Some items may interact with your medicine. What should I watch for while using this medicine? Your condition will be monitored carefully while you are receiving this medicine. Visit your doctor for check-ups as directed. What side effects may I notice from receiving this medicine? Side effects that you should report to your doctor or health care professional as soon as possible:  allergic reactions like skin rash, itching or hives, swelling of the face, lips, or tongue  breathing problems  changes in hearing  changes in vision  chills, flushing, or sweating  fast, irregular heartbeat  feeling faint or lightheaded, falls  fever, flu-like symptoms  high or low blood pressure  pain, tingling, numbness in the hands or feet  severe pain in the chest, back, flanks, or groin  swelling of the ankles, feet, hands  trouble passing urine or change in the amount of urine  unusually weak or tired Side effects that usually do not require medical attention (report to your doctor or health care professional if they continue or are bothersome):  cramps  dark colored stools  diarrhea  headache  nausea, vomiting  stomach upset This list may not describe all possible side effects. Call your doctor for medical advice about side effects. You may report side effects to FDA at 1-800-FDA-1088. Where should I keep my medicine? This drug is given in a hospital or clinic and will not be stored at home. NOTE: This sheet is a summary. It may not cover all   possible information. If you have questions about this medicine, talk to your doctor, pharmacist, or health care provider.  2021 Elsevier/Gold Standard (2008-03-20 15:58:57)   

## 2020-11-28 ENCOUNTER — Ambulatory Visit: Payer: BC Managed Care – PPO

## 2020-12-03 ENCOUNTER — Ambulatory Visit: Payer: BC Managed Care – PPO

## 2020-12-03 ENCOUNTER — Ambulatory Visit: Payer: BC Managed Care – PPO | Admitting: Family

## 2020-12-03 ENCOUNTER — Other Ambulatory Visit: Payer: BC Managed Care – PPO

## 2020-12-08 ENCOUNTER — Other Ambulatory Visit: Payer: Self-pay

## 2020-12-08 ENCOUNTER — Inpatient Hospital Stay: Payer: BC Managed Care – PPO | Attending: Family

## 2020-12-08 VITALS — BP 123/73 | HR 69 | Temp 98.3°F | Resp 17

## 2020-12-08 DIAGNOSIS — D500 Iron deficiency anemia secondary to blood loss (chronic): Secondary | ICD-10-CM | POA: Insufficient documentation

## 2020-12-08 DIAGNOSIS — D508 Other iron deficiency anemias: Secondary | ICD-10-CM

## 2020-12-08 DIAGNOSIS — K900 Celiac disease: Secondary | ICD-10-CM | POA: Insufficient documentation

## 2020-12-08 DIAGNOSIS — E538 Deficiency of other specified B group vitamins: Secondary | ICD-10-CM | POA: Diagnosis not present

## 2020-12-08 DIAGNOSIS — K909 Intestinal malabsorption, unspecified: Secondary | ICD-10-CM

## 2020-12-08 DIAGNOSIS — N920 Excessive and frequent menstruation with regular cycle: Secondary | ICD-10-CM | POA: Diagnosis not present

## 2020-12-08 DIAGNOSIS — N921 Excessive and frequent menstruation with irregular cycle: Secondary | ICD-10-CM

## 2020-12-08 MED ORDER — SODIUM CHLORIDE 0.9 % IV SOLN
125.0000 mg | Freq: Once | INTRAVENOUS | Status: AC
  Administered 2020-12-08: 125 mg via INTRAVENOUS
  Filled 2020-12-08: qty 10

## 2020-12-08 MED ORDER — SODIUM CHLORIDE 0.9 % IV SOLN
Freq: Once | INTRAVENOUS | Status: AC
  Filled 2020-12-08: qty 250

## 2020-12-08 NOTE — Patient Instructions (Signed)
Sodium Ferric Gluconate Complex injection What is this medicine? SODIUM FERRIC GLUCONATE COMPLEX (SOE dee um FER ik GLOO koe nate KOM pleks) is an iron replacement. It is used with epoetin therapy to treat low iron levels in patients who are receiving hemodialysis. This medicine may be used for other purposes; ask your health care provider or pharmacist if you have questions. COMMON BRAND NAME(S): Ferrlecit, Nulecit What should I tell my health care provider before I take this medicine? They need to know if you have any of the following conditions:  anemia that is not from iron deficiency  high levels of iron in the body  an unusual or allergic reaction to iron, benzyl alcohol, other medicines, foods, dyes, or preservatives  pregnant or are trying to become pregnant  breast-feeding How should I use this medicine? This medicine is for infusion into a vein. It is given by a health care professional in a hospital or clinic setting. Talk to your pediatrician regarding the use of this medicine in children. While this drug may be prescribed for children as young as 6 years old for selected conditions, precautions do apply. Overdosage: If you think you have taken too much of this medicine contact a poison control center or emergency room at once. NOTE: This medicine is only for you. Do not share this medicine with others. What if I miss a dose? It is important not to miss your dose. Call your doctor or health care professional if you are unable to keep an appointment. What may interact with this medicine? Do not take this medicine with any of the following medications:  deferoxamine  dimercaprol  other iron products This medicine may also interact with the following medications:  chloramphenicol  deferasirox  medicine for blood pressure like enalapril This list may not describe all possible interactions. Give your health care provider a list of all the medicines, herbs,  non-prescription drugs, or dietary supplements you use. Also tell them if you smoke, drink alcohol, or use illegal drugs. Some items may interact with your medicine. What should I watch for while using this medicine? Your condition will be monitored carefully while you are receiving this medicine. Visit your doctor for check-ups as directed. What side effects may I notice from receiving this medicine? Side effects that you should report to your doctor or health care professional as soon as possible:  allergic reactions like skin rash, itching or hives, swelling of the face, lips, or tongue  breathing problems  changes in hearing  changes in vision  chills, flushing, or sweating  fast, irregular heartbeat  feeling faint or lightheaded, falls  fever, flu-like symptoms  high or low blood pressure  pain, tingling, numbness in the hands or feet  severe pain in the chest, back, flanks, or groin  swelling of the ankles, feet, hands  trouble passing urine or change in the amount of urine  unusually weak or tired Side effects that usually do not require medical attention (report to your doctor or health care professional if they continue or are bothersome):  cramps  dark colored stools  diarrhea  headache  nausea, vomiting  stomach upset This list may not describe all possible side effects. Call your doctor for medical advice about side effects. You may report side effects to FDA at 1-800-FDA-1088. Where should I keep my medicine? This drug is given in a hospital or clinic and will not be stored at home. NOTE: This sheet is a summary. It may not cover all   possible information. If you have questions about this medicine, talk to your doctor, pharmacist, or health care provider.  2021 Elsevier/Gold Standard (2008-03-20 15:58:57)   

## 2020-12-08 NOTE — Progress Notes (Signed)
Pt declined to stay for post infusion observation period. Pt stated she has tolerated medication multiple times prior without difficulty. Pt aware to call clinic with any questions or concerns. Pt verbalized understanding and had no further questions.  ? ?

## 2020-12-11 DIAGNOSIS — J069 Acute upper respiratory infection, unspecified: Secondary | ICD-10-CM | POA: Diagnosis not present

## 2020-12-18 ENCOUNTER — Inpatient Hospital Stay: Payer: BC Managed Care – PPO

## 2020-12-18 ENCOUNTER — Encounter: Payer: Self-pay | Admitting: Family

## 2020-12-18 ENCOUNTER — Other Ambulatory Visit: Payer: Self-pay

## 2020-12-18 ENCOUNTER — Ambulatory Visit: Payer: BC Managed Care – PPO

## 2020-12-18 ENCOUNTER — Inpatient Hospital Stay (HOSPITAL_BASED_OUTPATIENT_CLINIC_OR_DEPARTMENT_OTHER): Payer: BC Managed Care – PPO | Admitting: Family

## 2020-12-18 VITALS — BP 124/76 | HR 72 | Temp 98.4°F | Resp 19 | Ht 66.0 in | Wt 157.1 lb

## 2020-12-18 DIAGNOSIS — D508 Other iron deficiency anemias: Secondary | ICD-10-CM

## 2020-12-18 DIAGNOSIS — N921 Excessive and frequent menstruation with irregular cycle: Secondary | ICD-10-CM

## 2020-12-18 DIAGNOSIS — E538 Deficiency of other specified B group vitamins: Secondary | ICD-10-CM

## 2020-12-18 DIAGNOSIS — K909 Intestinal malabsorption, unspecified: Secondary | ICD-10-CM

## 2020-12-18 DIAGNOSIS — D500 Iron deficiency anemia secondary to blood loss (chronic): Secondary | ICD-10-CM | POA: Diagnosis not present

## 2020-12-18 DIAGNOSIS — N920 Excessive and frequent menstruation with regular cycle: Secondary | ICD-10-CM | POA: Diagnosis not present

## 2020-12-18 DIAGNOSIS — K900 Celiac disease: Secondary | ICD-10-CM | POA: Diagnosis not present

## 2020-12-18 LAB — CBC WITH DIFFERENTIAL (CANCER CENTER ONLY)
Abs Immature Granulocytes: 0.01 10*3/uL (ref 0.00–0.07)
Basophils Absolute: 0.1 10*3/uL (ref 0.0–0.1)
Basophils Relative: 2 %
Eosinophils Absolute: 0.2 10*3/uL (ref 0.0–0.5)
Eosinophils Relative: 7 %
HCT: 26.6 % — ABNORMAL LOW (ref 36.0–46.0)
Hemoglobin: 7.3 g/dL — ABNORMAL LOW (ref 12.0–15.0)
Immature Granulocytes: 0 %
Lymphocytes Relative: 26 %
Lymphs Abs: 0.8 10*3/uL (ref 0.7–4.0)
MCH: 18.6 pg — ABNORMAL LOW (ref 26.0–34.0)
MCHC: 27.4 g/dL — ABNORMAL LOW (ref 30.0–36.0)
MCV: 67.9 fL — ABNORMAL LOW (ref 80.0–100.0)
Monocytes Absolute: 0.3 10*3/uL (ref 0.1–1.0)
Monocytes Relative: 10 %
Neutro Abs: 1.8 10*3/uL (ref 1.7–7.7)
Neutrophils Relative %: 55 %
Platelet Count: 167 10*3/uL (ref 150–400)
RBC: 3.92 MIL/uL (ref 3.87–5.11)
RDW: 26.5 % — ABNORMAL HIGH (ref 11.5–15.5)
WBC Count: 3.2 10*3/uL — ABNORMAL LOW (ref 4.0–10.5)
nRBC: 0 % (ref 0.0–0.2)

## 2020-12-18 LAB — RETICULOCYTES
Immature Retic Fract: 16.9 % — ABNORMAL HIGH (ref 2.3–15.9)
RBC.: 3.89 MIL/uL (ref 3.87–5.11)
Retic Count, Absolute: 47.1 10*3/uL (ref 19.0–186.0)
Retic Ct Pct: 1.2 % (ref 0.4–3.1)

## 2020-12-18 LAB — VITAMIN B12: Vitamin B-12: 143 pg/mL — ABNORMAL LOW (ref 180–914)

## 2020-12-18 MED ORDER — CYANOCOBALAMIN 1000 MCG/ML IJ SOLN
INTRAMUSCULAR | Status: AC
  Filled 2020-12-18: qty 1

## 2020-12-18 MED ORDER — CYANOCOBALAMIN 1000 MCG/ML IJ SOLN
1000.0000 ug | Freq: Once | INTRAMUSCULAR | Status: AC
  Administered 2020-12-18: 1000 ug via INTRAMUSCULAR

## 2020-12-18 NOTE — Patient Instructions (Signed)

## 2020-12-18 NOTE — Progress Notes (Signed)
Hematology and Oncology Follow Up Visit  Regina Mayo [id number] 08/19/1980 40 y.o. 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 She is doing well but has fatigue and weakness at times.  Hgb is now 7.3, MCV 67, platelets 167 and WBC count 3.2.  She has done well with IV iron infusions and B 12 injections. Iron studies and B 12 are pending.  She has not noted any abnormal blood loss. Cycle is heavy and last almost a week.  No bruising or petechiae.  No fever, chills, n/v, cough, rash, dizziness, SOB, chest pain, palpitations, abdominal pain or changes in bowel or bladder habits.  No swelling, numbness or tingling in her extremities.  No falls or syncope.  She is eating well and doing her best to stay well hydrated. Her weight is stable.   ECOG Performance Status: 1 - Symptomatic but completely ambulatory  Medications:  Allergies as of 12/18/2020   No Known Allergies     Medication List       Accurate as of Dec 18, 2020 11:09 AM. If you have any questions, ask your nurse or doctor.        EPINEPHrine 0.3 mg/0.3 mL Soaj injection Commonly known as: EPI-PEN Inject 0.3 mLs (0.3 mg total) into the muscle as needed for anaphylaxis.   fluticasone 50 MCG/ACT nasal spray Commonly known as: FLONASE   folic acid 1 MG tablet Commonly known as: FOLVITE Take 1 tablet (1 mg total) by mouth daily.   levothyroxine 50 MCG tablet Commonly known as: SYNTHROID   loratadine-pseudoephedrine 5-120 MG tablet Commonly known as: CLARITIN-D 12-hour Take 1 tablet by mouth 2 (two) times daily.   traZODone 50 MG tablet Commonly known as: DESYREL   Trintellix 20 MG Tabs tablet Generic drug: vortioxetine HBr   vitamin B-12 100 MCG  tablet Commonly known as: CYANOCOBALAMIN Take 100 mcg by mouth daily.   Vitamin D (Ergocalciferol) 1.25 MG (50000 UNIT) Caps capsule Commonly known as: DRISDOL Take 50,000 Units by mouth every 30 (thirty) days.   VITAMIN D3 PO       Allergies: No Known Allergies  Past Medical History, Surgical history, Social history, and Family History were reviewed and updated.  Review of Systems: All other 10 point review of systems is negative.   Physical Exam:  vitals were not taken for this visit.   Wt Readings from Last 3 Encounters:  11/03/20 159 lb 1.9 oz (72.2 kg)  10/02/20 161 lb (73 kg)  04/13/20 166 lb (75.3 kg)    Ocular: Sclerae unicteric, pupils equal, round and reactive to light Ear-nose-throat: Oropharynx clear, dentition fair Lymphatic: No cervical or supraclavicular adenopathy Lungs no rales or rhonchi, good excursion bilaterally Heart regular rate and rhythm, no murmur appreciated Abd soft, nontender, positive bowel sounds MSK no focal spinal tenderness, no joint edema Neuro: non-focal, well-oriented, appropriate affect Breasts: Deferred   Lab Results  Component Value Date   WBC 3.7 (L) 11/03/2020   HGB 6.0 (LL) 11/03/2020   HCT 22.3 (L) 11/03/2020   MCV 64.8 (L) 11/03/2020   PLT 141 (L) 11/03/2020   Lab Results  Component Value Date   FERRITIN <4 (L) 11/03/2020   IRON 20 (L) 11/03/2020   TIBC 455 (H) 11/03/2020   UIBC 435 (H) 11/03/2020  IRONPCTSAT 4 (L) 11/03/2020   Lab Results  Component Value Date   RETICCTPCT 1.2 12/18/2020   RBC 3.89 12/18/2020   RETICCTABS 58.1 08/08/2015   No results found for: KPAFRELGTCHN, LAMBDASER, KAPLAMBRATIO No results found for: IGGSERUM, IGA, IGMSERUM No results found for: Odetta Pink, SPEI   Chemistry      Component Value Date/Time   NA 140 11/03/2020 0911   NA 138 08/08/2015 1214   K 3.9 11/03/2020 0911   K 4.1 08/08/2015 1214   CL 107 11/03/2020 0911    CL 101 01/22/2015 1412   CO2 26 11/03/2020 0911   CO2 26 08/08/2015 1214   BUN 12 11/03/2020 0911   BUN 17.2 08/08/2015 1214   CREATININE 0.83 11/03/2020 0911   CREATININE 0.8 08/08/2015 1214      Component Value Date/Time   CALCIUM 9.0 11/03/2020 0911   CALCIUM 9.1 08/08/2015 1214   ALKPHOS 63 11/03/2020 0911   ALKPHOS 78 08/08/2015 1214   AST 12 (L) 11/03/2020 0911   AST 21 08/08/2015 1214   ALT 12 11/03/2020 0911   ALT 15 08/08/2015 1214   BILITOT 0.4 11/03/2020 0911   BILITOT <0.30 08/08/2015 1214       Impression and Plan: Regina Mayo is a very [name] with iron deficiency anemia secondary to menorrhagia and malabsorption with celiac disease. Iron studies are pending. We will replace if needed.  She received B 12 injection today as planned. She is also just started taking a sublingual supplement as well. B 12 level today is low at 100.  She will continue her same regimen with daily folic acid.  Follow-up in 6 weeks.  She can contact our office with any questions or concerns.  Laverna Peace, NP 5/19/202211:09 AM

## 2020-12-19 ENCOUNTER — Telehealth: Payer: Self-pay

## 2020-12-19 LAB — IRON AND TIBC
Iron: 15 ug/dL — ABNORMAL LOW (ref 41–142)
Saturation Ratios: 4 % — ABNORMAL LOW (ref 21–57)
TIBC: 414 ug/dL (ref 236–444)
UIBC: 399 ug/dL — ABNORMAL HIGH (ref 120–384)

## 2020-12-19 LAB — FERRITIN: Ferritin: 15 ng/mL (ref 11–307)

## 2020-12-19 NOTE — Telephone Encounter (Signed)
Called and left a vm to sch updated los appts and two doses of iv iron per sch message   Regina Mayo

## 2020-12-22 ENCOUNTER — Telehealth: Payer: Self-pay

## 2021-01-02 ENCOUNTER — Inpatient Hospital Stay: Payer: BC Managed Care – PPO | Attending: Family

## 2021-01-02 ENCOUNTER — Other Ambulatory Visit: Payer: Self-pay

## 2021-01-02 VITALS — BP 125/73 | HR 64 | Temp 99.3°F | Resp 18

## 2021-01-02 DIAGNOSIS — N920 Excessive and frequent menstruation with regular cycle: Secondary | ICD-10-CM | POA: Diagnosis not present

## 2021-01-02 DIAGNOSIS — K900 Celiac disease: Secondary | ICD-10-CM

## 2021-01-02 DIAGNOSIS — D500 Iron deficiency anemia secondary to blood loss (chronic): Secondary | ICD-10-CM | POA: Diagnosis not present

## 2021-01-02 DIAGNOSIS — N921 Excessive and frequent menstruation with irregular cycle: Secondary | ICD-10-CM

## 2021-01-02 DIAGNOSIS — D508 Other iron deficiency anemias: Secondary | ICD-10-CM

## 2021-01-02 DIAGNOSIS — K909 Intestinal malabsorption, unspecified: Secondary | ICD-10-CM

## 2021-01-02 MED ORDER — SODIUM CHLORIDE 0.9 % IV SOLN
Freq: Once | INTRAVENOUS | Status: AC
Start: 2021-01-02 — End: 2021-01-02
  Filled 2021-01-02: qty 250

## 2021-01-02 MED ORDER — SODIUM CHLORIDE 0.9 % IV SOLN
125.0000 mg | Freq: Once | INTRAVENOUS | Status: DC
  Filled 2021-01-02: qty 10

## 2021-01-02 MED ORDER — SODIUM CHLORIDE 0.9 % IV SOLN
125.0000 mg | Freq: Once | INTRAVENOUS | Status: AC
  Administered 2021-01-02: 125 mg via INTRAVENOUS
  Filled 2021-01-02: qty 125

## 2021-01-02 NOTE — Patient Instructions (Signed)
Ferric carboxymaltose injection What is this medicine? FERRIC CARBOXYMALTOSE (ferr-ik car-box-ee-mol-toes) is an iron complex. Iron is used to make healthy red blood cells, which carry oxygen and nutrients throughout the body. This medicine is used to treat anemia in people with chronic kidney disease or people who cannot take iron by mouth. This medicine may be used for other purposes; ask your health care provider or pharmacist if you have questions. COMMON BRAND NAME(S): Injectafer What should I tell my health care provider before I take this medicine? They need to know if you have any of these conditions:  high levels of iron in the blood  liver disease  an unusual or allergic reaction to iron, other medicines, foods, dyes, or preservatives  pregnant or trying to get pregnant  breast-feeding How should I use this medicine? This medicine is for infusion into a vein. It is given by a health care professional in a hospital or clinic setting. Talk to your pediatrician regarding the use of this medicine in children. Special care may be needed. Overdosage: If you think you have taken too much of this medicine contact a poison control center or emergency room at once. NOTE: This medicine is only for you. Do not share this medicine with others. What if I miss a dose? Keep appointments for follow-up doses. It is important not to miss your dose. Call your care team if you are unable to keep an appointment. What may interact with this medicine? Do not take this medicine with any of the following medications:  deferoxamine  dimercaprol  other iron products This list may not describe all possible interactions. Give your health care provider a list of all the medicines, herbs, non-prescription drugs, or dietary supplements you use. Also tell them if you smoke, drink alcohol, or use illegal drugs. Some items may interact with your medicine. What should I watch for while using this  medicine? Visit your doctor or health care professional regularly. Tell your doctor if your symptoms do not start to get better or if they get worse. You may need blood work done while you are taking this medicine. You may need to follow a special diet. Talk to your doctor. Foods that contain iron include: whole grains/cereals, dried fruits, beans, or peas, leafy green vegetables, and organ meats (liver, kidney). What side effects may I notice from receiving this medicine? Side effects that you should report to your doctor or health care professional as soon as possible:  allergic reactions like skin rash, itching or hives, swelling of the face, lips, or tongue  dizziness  facial flushing Side effects that usually do not require medical attention (report to your doctor or health care professional if they continue or are bothersome):  changes in taste  constipation  headache  nausea, vomiting  pain, redness, or irritation at site where injected This list may not describe all possible side effects. Call your doctor for medical advice about side effects. You may report side effects to FDA at 1-800-FDA-1088. Where should I keep my medicine? This drug is given in a hospital or clinic and will not be stored at home. NOTE: This sheet is a summary. It may not cover all possible information. If you have questions about this medicine, talk to your doctor, pharmacist, or health care provider.  2021 Elsevier/Gold Standard (2020-07-01 14:00:47)

## 2021-01-02 NOTE — Progress Notes (Signed)
Pt. refused to wait 30 min post infusion. Released stable and ASX.

## 2021-01-07 ENCOUNTER — Inpatient Hospital Stay: Payer: BC Managed Care – PPO

## 2021-01-07 ENCOUNTER — Telehealth: Payer: Self-pay

## 2021-01-07 ENCOUNTER — Other Ambulatory Visit: Payer: Self-pay

## 2021-01-07 VITALS — BP 102/60 | HR 80 | Temp 98.5°F | Resp 16

## 2021-01-07 DIAGNOSIS — N920 Excessive and frequent menstruation with regular cycle: Secondary | ICD-10-CM | POA: Diagnosis not present

## 2021-01-07 DIAGNOSIS — K909 Intestinal malabsorption, unspecified: Secondary | ICD-10-CM

## 2021-01-07 DIAGNOSIS — D500 Iron deficiency anemia secondary to blood loss (chronic): Secondary | ICD-10-CM | POA: Diagnosis not present

## 2021-01-07 DIAGNOSIS — N921 Excessive and frequent menstruation with irregular cycle: Secondary | ICD-10-CM

## 2021-01-07 DIAGNOSIS — D508 Other iron deficiency anemias: Secondary | ICD-10-CM

## 2021-01-07 DIAGNOSIS — K900 Celiac disease: Secondary | ICD-10-CM

## 2021-01-07 MED ORDER — SODIUM CHLORIDE 0.9 % IV SOLN
Freq: Once | INTRAVENOUS | Status: AC
  Filled 2021-01-07: qty 250

## 2021-01-07 MED ORDER — SODIUM CHLORIDE 0.9 % IV SOLN
125.0000 mg | Freq: Once | INTRAVENOUS | Status: AC
  Administered 2021-01-07: 125 mg via INTRAVENOUS
  Filled 2021-01-07: qty 125

## 2021-01-07 NOTE — Patient Instructions (Signed)
Sodium Ferric Gluconate Complex injection What is this medicine? SODIUM FERRIC GLUCONATE COMPLEX (SOE dee um FER ik GLOO koe nate KOM pleks) is an iron replacement. It is used with epoetin therapy to treat low iron levels in patients who are receiving hemodialysis. This medicine may be used for other purposes; ask your health care provider or pharmacist if you have questions. COMMON BRAND NAME(S): Ferrlecit, Nulecit What should I tell my health care provider before I take this medicine? They need to know if you have any of the following conditions:  anemia that is not from iron deficiency  high levels of iron in the body  an unusual or allergic reaction to iron, benzyl alcohol, other medicines, foods, dyes, or preservatives  pregnant or are trying to become pregnant  breast-feeding How should I use this medicine? This medicine is for infusion into a vein. It is given by a health care professional in a hospital or clinic setting. Talk to your pediatrician regarding the use of this medicine in children. While this drug may be prescribed for children as young as 6 years old for selected conditions, precautions do apply. Overdosage: If you think you have taken too much of this medicine contact a poison control center or emergency room at once. NOTE: This medicine is only for you. Do not share this medicine with others. What if I miss a dose? It is important not to miss your dose. Call your doctor or health care professional if you are unable to keep an appointment. What may interact with this medicine? Do not take this medicine with any of the following medications:  deferoxamine  dimercaprol  other iron products This medicine may also interact with the following medications:  chloramphenicol  deferasirox  medicine for blood pressure like enalapril This list may not describe all possible interactions. Give your health care provider a list of all the medicines, herbs,  non-prescription drugs, or dietary supplements you use. Also tell them if you smoke, drink alcohol, or use illegal drugs. Some items may interact with your medicine. What should I watch for while using this medicine? Your condition will be monitored carefully while you are receiving this medicine. Visit your doctor for check-ups as directed. What side effects may I notice from receiving this medicine? Side effects that you should report to your doctor or health care professional as soon as possible:  allergic reactions like skin rash, itching or hives, swelling of the face, lips, or tongue  breathing problems  changes in hearing  changes in vision  chills, flushing, or sweating  fast, irregular heartbeat  feeling faint or lightheaded, falls  fever, flu-like symptoms  high or low blood pressure  pain, tingling, numbness in the hands or feet  severe pain in the chest, back, flanks, or groin  swelling of the ankles, feet, hands  trouble passing urine or change in the amount of urine  unusually weak or tired Side effects that usually do not require medical attention (report to your doctor or health care professional if they continue or are bothersome):  cramps  dark colored stools  diarrhea  headache  nausea, vomiting  stomach upset This list may not describe all possible side effects. Call your doctor for medical advice about side effects. You may report side effects to FDA at 1-800-FDA-1088. Where should I keep my medicine? This drug is given in a hospital or clinic and will not be stored at home. NOTE: This sheet is a summary. It may not cover all   possible information. If you have questions about this medicine, talk to your doctor, pharmacist, or health care provider.  2021 Elsevier/Gold Standard (2008-03-20 15:58:57)   

## 2021-01-07 NOTE — Telephone Encounter (Signed)
pts appts, f/u made per sch message last los, calendar printed for pt  Regina Mayo

## 2021-01-09 ENCOUNTER — Ambulatory Visit: Payer: BC Managed Care – PPO

## 2021-01-30 ENCOUNTER — Telehealth: Payer: Self-pay | Admitting: *Deleted

## 2021-01-30 NOTE — Telephone Encounter (Signed)
Called and lvm of rescheduling appointment from 02/04/21 to 02/05/21 with Judson Roch - requested callback to confirm

## 2021-02-04 ENCOUNTER — Inpatient Hospital Stay: Payer: BC Managed Care – PPO

## 2021-02-04 ENCOUNTER — Inpatient Hospital Stay: Payer: BC Managed Care – PPO | Admitting: Family

## 2021-02-04 ENCOUNTER — Telehealth: Payer: Self-pay | Admitting: *Deleted

## 2021-02-04 NOTE — Telephone Encounter (Signed)
Called and lvm of appoint being rescheduled from 02/05/21 to 02/24/21 - request call back to confirm.

## 2021-02-05 ENCOUNTER — Other Ambulatory Visit: Payer: BC Managed Care – PPO

## 2021-02-05 ENCOUNTER — Ambulatory Visit: Payer: BC Managed Care – PPO | Admitting: Family

## 2021-02-05 ENCOUNTER — Ambulatory Visit: Payer: BC Managed Care – PPO

## 2021-02-24 ENCOUNTER — Inpatient Hospital Stay: Payer: BC Managed Care – PPO

## 2021-02-24 ENCOUNTER — Other Ambulatory Visit: Payer: Self-pay

## 2021-02-24 ENCOUNTER — Encounter: Payer: Self-pay | Admitting: Family

## 2021-02-24 ENCOUNTER — Inpatient Hospital Stay: Payer: BC Managed Care – PPO | Attending: Family

## 2021-02-24 ENCOUNTER — Inpatient Hospital Stay: Payer: BC Managed Care – PPO | Admitting: Family

## 2021-02-24 VITALS — BP 120/80 | HR 54 | Temp 98.0°F | Resp 17 | Wt 167.4 lb

## 2021-02-24 DIAGNOSIS — D508 Other iron deficiency anemias: Secondary | ICD-10-CM

## 2021-02-24 DIAGNOSIS — E538 Deficiency of other specified B group vitamins: Secondary | ICD-10-CM

## 2021-02-24 DIAGNOSIS — N920 Excessive and frequent menstruation with regular cycle: Secondary | ICD-10-CM | POA: Diagnosis not present

## 2021-02-24 DIAGNOSIS — K909 Intestinal malabsorption, unspecified: Secondary | ICD-10-CM

## 2021-02-24 DIAGNOSIS — N921 Excessive and frequent menstruation with irregular cycle: Secondary | ICD-10-CM

## 2021-02-24 DIAGNOSIS — D500 Iron deficiency anemia secondary to blood loss (chronic): Secondary | ICD-10-CM | POA: Diagnosis not present

## 2021-02-24 DIAGNOSIS — K900 Celiac disease: Secondary | ICD-10-CM | POA: Insufficient documentation

## 2021-02-24 LAB — CBC WITH DIFFERENTIAL (CANCER CENTER ONLY)
Abs Immature Granulocytes: 0.04 10*3/uL (ref 0.00–0.07)
Basophils Absolute: 0.1 10*3/uL (ref 0.0–0.1)
Basophils Relative: 2 %
Eosinophils Absolute: 0.2 10*3/uL (ref 0.0–0.5)
Eosinophils Relative: 6 %
HCT: 29.1 % — ABNORMAL LOW (ref 36.0–46.0)
Hemoglobin: 8.3 g/dL — ABNORMAL LOW (ref 12.0–15.0)
Immature Granulocytes: 1 %
Lymphocytes Relative: 28 %
Lymphs Abs: 1.2 10*3/uL (ref 0.7–4.0)
MCH: 20 pg — ABNORMAL LOW (ref 26.0–34.0)
MCHC: 28.5 g/dL — ABNORMAL LOW (ref 30.0–36.0)
MCV: 70 fL — ABNORMAL LOW (ref 80.0–100.0)
Monocytes Absolute: 0.4 10*3/uL (ref 0.1–1.0)
Monocytes Relative: 9 %
Neutro Abs: 2.3 10*3/uL (ref 1.7–7.7)
Neutrophils Relative %: 54 %
Platelet Count: 181 10*3/uL (ref 150–400)
RBC: 4.16 MIL/uL (ref 3.87–5.11)
RDW: 23.8 % — ABNORMAL HIGH (ref 11.5–15.5)
WBC Count: 4.1 10*3/uL (ref 4.0–10.5)
nRBC: 0 % (ref 0.0–0.2)

## 2021-02-24 LAB — RETICULOCYTES
Immature Retic Fract: 21.4 % — ABNORMAL HIGH (ref 2.3–15.9)
RBC.: 4.23 MIL/uL (ref 3.87–5.11)
Retic Count, Absolute: 45.3 10*3/uL (ref 19.0–186.0)
Retic Ct Pct: 1.1 % (ref 0.4–3.1)

## 2021-02-24 LAB — VITAMIN B12: Vitamin B-12: 120 pg/mL — ABNORMAL LOW (ref 180–914)

## 2021-02-24 MED ORDER — CYANOCOBALAMIN 1000 MCG/ML IJ SOLN
1000.0000 ug | Freq: Once | INTRAMUSCULAR | Status: AC
  Administered 2021-02-24: 1000 ug via INTRAMUSCULAR

## 2021-02-24 MED ORDER — CYANOCOBALAMIN 1000 MCG/ML IJ SOLN
INTRAMUSCULAR | Status: AC
  Filled 2021-02-24: qty 1

## 2021-02-24 NOTE — Progress Notes (Signed)
Hematology and Oncology Follow Up Visit  Regina Mayo [id number] [birth year] 40 y.o. 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 She is doing well but has had a lighter, "thinner" cycle this month that has lasted over a week.  She has discussed this with her PCP and if her abdominal cramping and cycle do not resolve she will go back to see her gynecologist.  No other blood loss noted. No bruising or petechiae.  No fever, chills, n/v, cough, rash, dizziness, SOB, chest pain, palpitations, abdominal pain or changes in bowel or bladder habits.  No swelling, tenderness, numbness or tingling in her extremities at this time.  No falls or syncope.  She has been eating well and staying hydrated. Her weight is 167 lbs.   ECOG Performance Status: 1 - Symptomatic but completely ambulatory  Medications:  Allergies as of 02/24/2021   No Known Allergies      Medication List        Accurate as of February 24, 2021  2:37 PM. If you have any questions, ask your nurse or doctor.          EPINEPHrine 0.3 mg/0.3 mL Soaj injection Commonly known as: EPI-PEN Inject 0.3 mLs (0.3 mg total) into the muscle as needed for anaphylaxis.   fluticasone 50 MCG/ACT nasal spray Commonly known as: FLONASE   folic acid 1 MG tablet Commonly known as: FOLVITE Take 1 tablet (1 mg total) by mouth daily.   levothyroxine 50 MCG tablet Commonly known as: SYNTHROID   loratadine-pseudoephedrine 5-120 MG tablet Commonly known as: CLARITIN-D 12-hour Take 1 tablet by mouth 2 (two) times daily.   traZODone 50 MG tablet Commonly known as: DESYREL   Trintellix 20 MG Tabs tablet Generic drug: vortioxetine HBr   vitamin B-12 100 MCG tablet Commonly known as:  CYANOCOBALAMIN Take 100 mcg by mouth daily.   Vitamin D (Ergocalciferol) 1.25 MG (50000 UNIT) Caps capsule Commonly known as: DRISDOL Take 50,000 Units by mouth every 30 (thirty) days.   VITAMIN D3 PO        Allergies: No Known Allergies  Past Medical History, Surgical history, Social history, and Family History were reviewed and updated.  Review of Systems: All other 10 point review of systems is negative.   Physical Exam:  vitals were not taken for this visit.   Wt Readings from Last 3 Encounters:  12/18/20 157 lb 1.9 oz (71.3 kg)  11/03/20 159 lb 1.9 oz (72.2 kg)  10/02/20 161 lb (73 kg)    Ocular: Sclerae unicteric, pupils equal, round and reactive to light Ear-nose-throat: Oropharynx clear, dentition fair Lymphatic: No cervical or supraclavicular adenopathy Lungs no rales or rhonchi, good excursion bilaterally Heart regular rate and rhythm, no murmur appreciated Abd soft, nontender, positive bowel sounds MSK no focal spinal tenderness, no joint edema Neuro: non-focal, well-oriented, appropriate affect Breasts: Deferred   Lab Results  Component Value Date   WBC 3.2 (L) 12/18/2020   HGB 7.3 (L) 12/18/2020   HCT 26.6 (L) 12/18/2020   MCV 67.9 (L) 12/18/2020   PLT 167 12/18/2020   Lab Results  Component Value Date   FERRITIN 15 12/18/2020   IRON 15 (L) 12/18/2020   TIBC 414 12/18/2020   UIBC 399 (H)  12/18/2020   IRONPCTSAT 4 (L) 12/18/2020   Lab Results  Component Value Date   RETICCTPCT 1.2 12/18/2020   RBC 3.89 12/18/2020   RETICCTABS 58.1 08/08/2015   No results found for: KPAFRELGTCHN, LAMBDASER, KAPLAMBRATIO No results found for: IGGSERUM, IGA, IGMSERUM No results found for: Odetta Pink, SPEI   Chemistry      Component Value Date/Time   NA 140 11/03/2020 0911   NA 138 08/08/2015 1214   K 3.9 11/03/2020 0911   K 4.1 08/08/2015 1214   CL 107 11/03/2020 0911   CL 101 01/22/2015 1412    CO2 26 11/03/2020 0911   CO2 26 08/08/2015 1214   BUN 12 11/03/2020 0911   BUN 17.2 08/08/2015 1214   CREATININE 0.83 11/03/2020 0911   CREATININE 0.8 08/08/2015 1214      Component Value Date/Time   CALCIUM 9.0 11/03/2020 0911   CALCIUM 9.1 08/08/2015 1214   ALKPHOS 63 11/03/2020 0911   ALKPHOS 78 08/08/2015 1214   AST 12 (L) 11/03/2020 0911   AST 21 08/08/2015 1214   ALT 12 11/03/2020 0911   ALT 15 08/08/2015 1214   BILITOT 0.4 11/03/2020 0911   BILITOT <0.30 08/08/2015 1214       Impression and Plan: Regina Mayo is a very pleasant 40 yo [name] with iron deficiency anemia secondary to menorrhagia and malabsorption with celiac disease. Iron studies are pending. We can replace if needed.  She received B 12 injection. It is easier for her to get this only with follow-up instead of monthly due to her work schedule.  Follow-up in 2 months.  She can contact our office with any questions or concerns.   Laverna Peace, NP 7/26/20222:37 PM

## 2021-02-24 NOTE — Patient Instructions (Signed)
Hydroxocobalamin injection What is this medication? HYDROXOCOBALAMIN (hye drox oh koe BAL a min) is a form of vitamin B12. Vitamin B12 helps the growth of healthy blood cells, nerve cells, and proteins in the body. It also helps with the metabolism of fats and carbohydrates. This medicine is used to treat people with low levels of vitamin B12 or those who can not absorb vitamin B12. It also helps with the metabolism of fats andcarbohydrates. It is also used as an antidote to treat cyanide poisoning. This medicine may be used for other purposes; ask your health care provider orpharmacist if you have questions. COMMON BRAND NAME(S): Cyanokit, Primabalt-RP What should I tell my care team before I take this medication? They need to know if you have any of these conditions: high blood pressure iron-deficiency anemia kidney disease low levels of folic acid in the blood megaloblastic anemia an unusual or allergic reaction to hydroxocobalamin, cyanocobalamin, cobalt, other medicines, foods, dyes, or preservatives pregnant or trying to get pregnant breast-feeding How should I use this medication? This medicine is for injection into a muscle or a vein. It is given by a healthcare professional in a clinic, doctor's office, or hospital. Talk to your pediatrician regarding the use of this medicine in children.Special care may be needed. Overdosage: If you think you have taken too much of this medicine contact apoison control center or emergency room at once. NOTE: This medicine is only for you. Do not share this medicine with others. What if I miss a dose? It is important not to miss your dose. Call your doctor or health careprofessional if you are unable to keep an appointment. What may interact with this medication? chemotherapy chloramphenicol This list may not describe all possible interactions. Give your health care provider a list of all the medicines, herbs, non-prescription drugs, or dietary  supplements you use. Also tell them if you smoke, drink alcohol, or use illegaldrugs. Some items may interact with your medicine. What should I watch for while using this medication? Visit your doctor or health care professional regularly. You may need bloodwork done while you are taking this medicine. You may need to follow a special diet. Talk to your doctor. Limit your alcoholintake and avoid smoking to get the best benefit. What side effects may I notice from receiving this medication? Side effects that you should report to your doctor or health care professionalas soon as possible: allergic reactions like skin rash, itching or hives, swelling of the face, lips, or tongue breathing problems chest tightness, pain dizziness shaking or shivering Side effects that usually do not require medical attention (report to yourdoctor or health care professional if they continue or are bothersome): diarrhea headache nausea, vomiting pain at site where injected red colored urine red tint to skin This list may not describe all possible side effects. Call your doctor for medical advice about side effects. You may report side effects to FDA at1-800-FDA-1088. Where should I keep my medication? This drug is given in a hospital or clinic and will not be stored at home. NOTE: This sheet is a summary. It may not cover all possible information. If you have questions about this medicine, talk to your doctor, pharmacist, orhealth care provider.  2022 Elsevier/Gold Standard (2007-12-04 14:49:26)

## 2021-02-25 ENCOUNTER — Telehealth: Payer: Self-pay | Admitting: *Deleted

## 2021-02-25 LAB — IRON AND TIBC
Iron: 15 ug/dL — ABNORMAL LOW (ref 41–142)
Saturation Ratios: 3 % — ABNORMAL LOW (ref 21–57)
TIBC: 461 ug/dL — ABNORMAL HIGH (ref 236–444)
UIBC: 446 ug/dL — ABNORMAL HIGH (ref 120–384)

## 2021-02-25 LAB — FERRITIN: Ferritin: <4 ng/mL — ABNORMAL LOW (ref 11–307)

## 2021-02-25 NOTE — Telephone Encounter (Signed)
Per 02/24/21 los - called and gave upcoming appointments - patient confirmed

## 2021-03-23 ENCOUNTER — Other Ambulatory Visit: Payer: Self-pay

## 2021-03-23 ENCOUNTER — Inpatient Hospital Stay: Payer: BC Managed Care – PPO | Attending: Family

## 2021-03-23 VITALS — BP 113/58 | HR 80 | Temp 98.0°F | Resp 16

## 2021-03-23 DIAGNOSIS — E538 Deficiency of other specified B group vitamins: Secondary | ICD-10-CM | POA: Diagnosis not present

## 2021-03-23 DIAGNOSIS — D508 Other iron deficiency anemias: Secondary | ICD-10-CM

## 2021-03-23 DIAGNOSIS — D500 Iron deficiency anemia secondary to blood loss (chronic): Secondary | ICD-10-CM | POA: Diagnosis not present

## 2021-03-23 DIAGNOSIS — K909 Intestinal malabsorption, unspecified: Secondary | ICD-10-CM | POA: Insufficient documentation

## 2021-03-23 DIAGNOSIS — N921 Excessive and frequent menstruation with irregular cycle: Secondary | ICD-10-CM

## 2021-03-23 DIAGNOSIS — N920 Excessive and frequent menstruation with regular cycle: Secondary | ICD-10-CM | POA: Diagnosis not present

## 2021-03-23 DIAGNOSIS — K900 Celiac disease: Secondary | ICD-10-CM

## 2021-03-23 MED ORDER — CYANOCOBALAMIN 1000 MCG/ML IJ SOLN
1000.0000 ug | Freq: Once | INTRAMUSCULAR | Status: AC
  Administered 2021-03-23: 1000 ug via INTRAMUSCULAR
  Filled 2021-03-23: qty 1

## 2021-03-23 MED ORDER — SODIUM CHLORIDE 0.9 % IV SOLN: Freq: Once | INTRAVENOUS | Status: AC

## 2021-03-23 MED ORDER — SODIUM CHLORIDE 0.9 % IV SOLN
125.0000 mg | Freq: Once | INTRAVENOUS | Status: AC
  Administered 2021-03-23: 125 mg via INTRAVENOUS
  Filled 2021-03-23: qty 10

## 2021-03-23 NOTE — Patient Instructions (Signed)
Ferric carboxymaltose injection What is this medication? FERRIC CARBOXYMALTOSE (FER ik car BOX ee MAWL tose) treats anemia in people with chronic kidney disease or people who cannot take iron by mouth. It works by increasing iron in your body which helps red blood cells deliver oxygen fromthe lungs to cells all over the body. This medicine may be used for other purposes; ask your health care provider orpharmacist if you have questions. COMMON BRAND NAME(S): Injectafer What should I tell my care team before I take this medication? They need to know if you have any of these conditions: High blood pressure High levels of iron in the blood An unusual or allergic reaction to iron, other medications, foods, dyes, or preservatives Pregnant or trying to get pregnant Breast-feeding How should I use this medication? This medication is injected into a vein. It is given by your care team in [place] or clinic setting. Talk to your care team about the use of this medication in children. While it may be given to children as young as 1 year for selected conditions,precautions do apply. Overdosage: If you think you have taken too much of this medicine contact apoison control center or emergency room at once. NOTE: This medicine is only for you. Do not share this medicine with others. What if I miss a dose? Keep appointments for follow-up doses. It is important not to miss your dose.Call your care team if you are unable to keep an appointment. What may interact with this medication? Do not take this medication with any of the following medications: Deferoxamine Dimercaprol Other iron products This list may not describe all possible interactions. Give your health care provider a list of all the medicines, herbs, non-prescription drugs, or dietary supplements you use. Also tell them if you smoke, drink alcohol, or use illegaldrugs. Some items may interact with your medicine. What should I watch for while  using this medication? Visit your care team for regular checks on your progress. Tell your care teamif your symptoms do not start to get better or if they get worse. You may need blood work while you are taking this medication. You may need to eat more foods that contain iron. Talk to your care team. Foods that contain iron include whole grains/cereals, dried fruits, beans, or peas,leafy green vegetables, and organ meats (liver, kidney). What side effects may I notice from receiving this medication? Side effects that you should report to your care team as soon as possible: Allergic reactions-skin rash, itching, hives, swelling of the face, lips, tongue, or throat Dizziness Facial flushing, redness Increase in blood pressure Low phosphorous levels-loss of appetite, muscle weakness Side effects that usually do not require medical attention (report to your careteam if they continue or are bothersome): Change in taste Constipation Headache Nausea Pain, redness, or irritation at injection site Vomiting This list may not describe all possible side effects. Call your doctor for medical advice about side effects. You may report side effects to FDA at1-800-FDA-1088. Where should I keep my medication? This medication is given in a hospital or clinic. It will not be stored at home. NOTE: This sheet is a summary. It may not cover all possible information. If you have questions about this medicine, talk to your doctor, pharmacist, orhealth care provider.  2022 Elsevier/Gold Standard (2020-08-15 12:26:19)

## 2021-03-25 ENCOUNTER — Other Ambulatory Visit (HOSPITAL_BASED_OUTPATIENT_CLINIC_OR_DEPARTMENT_OTHER): Payer: Self-pay | Admitting: Internal Medicine

## 2021-03-25 ENCOUNTER — Other Ambulatory Visit: Payer: Self-pay

## 2021-03-25 ENCOUNTER — Encounter (HOSPITAL_BASED_OUTPATIENT_CLINIC_OR_DEPARTMENT_OTHER): Payer: Self-pay

## 2021-03-25 ENCOUNTER — Ambulatory Visit (HOSPITAL_BASED_OUTPATIENT_CLINIC_OR_DEPARTMENT_OTHER)
Admission: RE | Admit: 2021-03-25 | Discharge: 2021-03-25 | Disposition: A | Discharge status: Home / Self Care | Payer: BC Managed Care – PPO | Source: Ambulatory Visit | Attending: Internal Medicine | Admitting: Internal Medicine

## 2021-03-25 DIAGNOSIS — M25561 Pain in right knee: Secondary | ICD-10-CM | POA: Insufficient documentation

## 2021-03-25 DIAGNOSIS — N926 Irregular menstruation, unspecified: Secondary | ICD-10-CM

## 2021-04-07 ENCOUNTER — Inpatient Hospital Stay: Payer: BC Managed Care – PPO | Attending: Family

## 2021-04-07 ENCOUNTER — Other Ambulatory Visit: Payer: Self-pay

## 2021-04-07 VITALS — BP 106/66 | HR 62 | Temp 98.4°F | Resp 18

## 2021-04-07 DIAGNOSIS — E538 Deficiency of other specified B group vitamins: Secondary | ICD-10-CM | POA: Diagnosis not present

## 2021-04-07 DIAGNOSIS — K900 Celiac disease: Secondary | ICD-10-CM

## 2021-04-07 DIAGNOSIS — N921 Excessive and frequent menstruation with irregular cycle: Secondary | ICD-10-CM

## 2021-04-07 DIAGNOSIS — D508 Other iron deficiency anemias: Secondary | ICD-10-CM

## 2021-04-07 DIAGNOSIS — K909 Intestinal malabsorption, unspecified: Secondary | ICD-10-CM

## 2021-04-07 DIAGNOSIS — D500 Iron deficiency anemia secondary to blood loss (chronic): Secondary | ICD-10-CM | POA: Insufficient documentation

## 2021-04-07 DIAGNOSIS — N920 Excessive and frequent menstruation with regular cycle: Secondary | ICD-10-CM | POA: Insufficient documentation

## 2021-04-07 MED ORDER — SODIUM CHLORIDE 0.9 % IV SOLN
125.0000 mg | Freq: Once | INTRAVENOUS | Status: AC
  Administered 2021-04-07: 125 mg via INTRAVENOUS
  Filled 2021-04-07: qty 125

## 2021-04-07 MED ORDER — SODIUM CHLORIDE 0.9 % IV SOLN: Freq: Once | INTRAVENOUS | Status: AC

## 2021-04-07 NOTE — Patient Instructions (Signed)
Sodium Ferric Gluconate Complex Injection What is this medication? SODIUM FERRIC GLUCONATE COMPLEX (SOE dee um FER ik GLOO koe nate KOM pleks) treats low levels of iron (iron deficiency anemia) in people with kidney disease. Iron is a mineral that plays an important role in making red blood cells, which carry oxygen from your lungs to the rest of your body. This medicine may be used for other purposes; ask your health care provider or pharmacist if you have questions. COMMON BRAND NAME(S): Ferrlecit, Nulecit What should I tell my care team before I take this medication? They need to know if you have any of the following conditions: Anemia that is not from iron deficiency High levels of iron in the blood An unusual or allergic reaction to iron, other medications, foods, dyes, or preservatives Pregnant or are trying to become pregnant Breast-feeding How should I use this medication? This medication is injected into a vein. It is given by your care team in a hospital or clinic setting. Talk to your care team about the use of this medication in children. While it may be prescribed for children as young as 6 years for selected conditions, precautions do apply. Overdosage: If you think you have taken too much of this medicine contact a poison control center or emergency room at once. NOTE: This medicine is only for you. Do not share this medicine with others. What if I miss a dose? It is important not to miss your dose. Call your care team if you are unable to keep an appointment. What may interact with this medication? Do not take this medication with any of the following: Deferasirox Deferoxamine Dimercaprol This medication may also interact with the following: Other iron products This list may not describe all possible interactions. Give your health care provider a list of all the medicines, herbs, non-prescription drugs, or dietary supplements you use. Also tell them if you smoke, drink  alcohol, or use illegal drugs. Some items may interact with your medicine. What should I watch for while using this medication? Your condition will be monitored carefully while you are receiving this medication. Visit your care team for regular checks on your progress. You may need blood work while you are taking this medication. What side effects may I notice from receiving this medication? Side effects that you should report to your care team as soon as possible: Allergic reactions-skin rash, itching, hives, swelling of the face, lips, tongue, or throat Low blood pressure-dizziness, feeling faint or lightheaded, blurry vision Shortness of breath Side effects that usually do not require medical attention (report to your care team if they continue or are bothersome): Flushing Headache Joint pain Muscle pain Nausea Pain, redness, or irritation at injection site This list may not describe all possible side effects. Call your doctor for medical advice about side effects. You may report side effects to FDA at 1-800-FDA-1088. Where should I keep my medication? This medication is given in a hospital or clinic and will not be stored at home. NOTE: This sheet is a summary. It may not cover all possible information. If you have questions about this medicine, talk to your doctor, pharmacist, or health care provider.  2022 Elsevier/Gold Standard (2020-09-01 11:46:34)

## 2021-04-27 ENCOUNTER — Inpatient Hospital Stay: Payer: BC Managed Care – PPO

## 2021-04-27 ENCOUNTER — Encounter: Payer: Self-pay | Admitting: Family

## 2021-04-27 ENCOUNTER — Other Ambulatory Visit: Payer: Self-pay

## 2021-04-27 ENCOUNTER — Inpatient Hospital Stay (HOSPITAL_BASED_OUTPATIENT_CLINIC_OR_DEPARTMENT_OTHER): Payer: BC Managed Care – PPO | Admitting: Family

## 2021-04-27 VITALS — BP 120/81 | HR 68 | Temp 98.4°F | Resp 17 | Wt 163.8 lb

## 2021-04-27 DIAGNOSIS — D500 Iron deficiency anemia secondary to blood loss (chronic): Secondary | ICD-10-CM | POA: Diagnosis not present

## 2021-04-27 DIAGNOSIS — E538 Deficiency of other specified B group vitamins: Secondary | ICD-10-CM

## 2021-04-27 DIAGNOSIS — D508 Other iron deficiency anemias: Secondary | ICD-10-CM

## 2021-04-27 DIAGNOSIS — N920 Excessive and frequent menstruation with regular cycle: Secondary | ICD-10-CM | POA: Diagnosis not present

## 2021-04-27 DIAGNOSIS — K900 Celiac disease: Secondary | ICD-10-CM

## 2021-04-27 DIAGNOSIS — N921 Excessive and frequent menstruation with irregular cycle: Secondary | ICD-10-CM | POA: Diagnosis not present

## 2021-04-27 DIAGNOSIS — K909 Intestinal malabsorption, unspecified: Secondary | ICD-10-CM

## 2021-04-27 LAB — CBC WITH DIFFERENTIAL (CANCER CENTER ONLY)
Abs Immature Granulocytes: 0.02 10*3/uL (ref 0.00–0.07)
Basophils Absolute: 0.1 10*3/uL (ref 0.0–0.1)
Basophils Relative: 1 %
Eosinophils Absolute: 0.1 10*3/uL (ref 0.0–0.5)
Eosinophils Relative: 3 %
HCT: 29.2 % — ABNORMAL LOW (ref 36.0–46.0)
Hemoglobin: 8.5 g/dL — ABNORMAL LOW (ref 12.0–15.0)
Immature Granulocytes: 0 %
Lymphocytes Relative: 27 %
Lymphs Abs: 1.3 10*3/uL (ref 0.7–4.0)
MCH: 20.9 pg — ABNORMAL LOW (ref 26.0–34.0)
MCHC: 29.1 g/dL — ABNORMAL LOW (ref 30.0–36.0)
MCV: 71.9 fL — ABNORMAL LOW (ref 80.0–100.0)
Monocytes Absolute: 0.4 10*3/uL (ref 0.1–1.0)
Monocytes Relative: 8 %
Neutro Abs: 3 10*3/uL (ref 1.7–7.7)
Neutrophils Relative %: 61 %
Platelet Count: 179 10*3/uL (ref 150–400)
RBC: 4.06 MIL/uL (ref 3.87–5.11)
RDW: 21.8 % — ABNORMAL HIGH (ref 11.5–15.5)
WBC Count: 4.9 10*3/uL (ref 4.0–10.5)
nRBC: 0 % (ref 0.0–0.2)

## 2021-04-27 LAB — RETICULOCYTES
Immature Retic Fract: 15 % (ref 2.3–15.9)
RBC.: 4.04 MIL/uL (ref 3.87–5.11)
Retic Count, Absolute: 47.3 10*3/uL (ref 19.0–186.0)
Retic Ct Pct: 1.2 % (ref 0.4–3.1)

## 2021-04-27 LAB — VITAMIN B12: Vitamin B-12: 160 pg/mL — ABNORMAL LOW (ref 180–914)

## 2021-04-27 MED ORDER — CYANOCOBALAMIN 1000 MCG/ML IJ SOLN
1000.0000 ug | Freq: Once | INTRAMUSCULAR | Status: AC
  Administered 2021-04-27: 1000 ug via INTRAMUSCULAR
  Filled 2021-04-27: qty 1

## 2021-04-27 NOTE — Patient Instructions (Signed)

## 2021-04-27 NOTE — Progress Notes (Signed)
Hematology and Oncology Follow Up Visit  Regina Mayo [id number] 09-02-1980 40 y.o. 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 She is doing well but does note some fatigue and SOB with exertion (stairs).  Her cycles has been irregular but not as heavy. She states that she was able to see her gynecologist and her exam was negative.  No other blood loss noted. No bruising or petechiae.  No fever, chills, n/v, cough, rash, dizziness, chest pain, palpitations, abdominal pain or changes in bowel or bladder habits.  No swelling, tenderness, numbness or tingling in her extremities.  No falls or syncope to report.  She has maintained a good appetite and is doing her best to stay well hydrated. Her weight is stable at 163 lbs.   ECOG Performance Status: 1 - Symptomatic but completely ambulatory  Medications:  Allergies as of 04/27/2021   No Known Allergies      Medication List        Accurate as of April 27, 2021  2:40 PM. If you have any questions, ask your nurse or doctor.          EPINEPHrine 0.3 mg/0.3 mL Soaj injection Commonly known as: EPI-PEN Inject 0.3 mLs (0.3 mg total) into the muscle as needed for anaphylaxis.   fluticasone 50 MCG/ACT nasal spray Commonly known as: FLONASE Place 2 sprays into both nostrils daily. As needed   folic acid 1 MG tablet Commonly known as: FOLVITE Take 1 tablet (1 mg total) by mouth daily.   levothyroxine 50 MCG tablet Commonly known as: SYNTHROID   loratadine-pseudoephedrine 5-120 MG tablet Commonly known as: CLARITIN-D 12-hour Take 1 tablet by mouth 2 (two) times daily.   traZODone 50 MG tablet Commonly known as: DESYREL Take 50 mg by  mouth at bedtime as needed.   Trintellix 20 MG Tabs tablet Generic drug: vortioxetine HBr Take 20 mg by mouth daily.   vitamin B-12 100 MCG tablet Commonly known as: CYANOCOBALAMIN Take 100 mcg by mouth daily.   Vitamin D (Ergocalciferol) 1.25 MG (50000 UNIT) Caps capsule Commonly known as: DRISDOL Take 50,000 Units by mouth every 30 (thirty) days.   VITAMIN D3 PO        Allergies: No Known Allergies  Past Medical History, Surgical history, Social history, and Family History were reviewed and updated.  Review of Systems: All other 10 point review of systems is negative.   Physical Exam:  vitals were not taken for this visit.   Wt Readings from Last 3 Encounters:  02/24/21 167 lb 6.4 oz (75.9 kg)  12/18/20 157 lb 1.9 oz (71.3 kg)  11/03/20 159 lb 1.9 oz (72.2 kg)    Ocular: Sclerae unicteric, pupils equal, round and reactive to light Ear-nose-throat: Oropharynx clear, dentition fair Lymphatic: No cervical or supraclavicular adenopathy Lungs no rales or rhonchi, good excursion bilaterally Heart regular rate and rhythm, no murmur appreciated Abd soft, nontender, positive bowel sounds MSK no focal spinal tenderness, no joint edema Neuro: non-focal, well-oriented, appropriate affect Breasts: Deferred   Lab Results  Component Value Date   WBC 4.1 02/24/2021   HGB 8.3 (L) 02/24/2021   HCT 29.1 (L) 02/24/2021   MCV 70.0 (L)  02/24/2021   PLT 181 02/24/2021   Lab Results  Component Value Date   FERRITIN <4 (L) 02/24/2021   IRON 15 (L) 02/24/2021   TIBC 461 (H) 02/24/2021   UIBC 446 (H) 02/24/2021   IRONPCTSAT 3 (L) 02/24/2021   Lab Results  Component Value Date   RETICCTPCT 1.1 02/24/2021   RBC 4.16 02/24/2021   RBC 4.23 02/24/2021   RETICCTABS 58.1 08/08/2015   No results found for: KPAFRELGTCHN, LAMBDASER, KAPLAMBRATIO No results found for: IGGSERUM, IGA, IGMSERUM No results found for: Odetta Pink,  SPEI   Chemistry      Component Value Date/Time   NA 140 11/03/2020 0911   NA 138 08/08/2015 1214   K 3.9 11/03/2020 0911   K 4.1 08/08/2015 1214   CL 107 11/03/2020 0911   CL 101 01/22/2015 1412   CO2 26 11/03/2020 0911   CO2 26 08/08/2015 1214   BUN 12 11/03/2020 0911   BUN 17.2 08/08/2015 1214   CREATININE 0.83 11/03/2020 0911   CREATININE 0.8 08/08/2015 1214      Component Value Date/Time   CALCIUM 9.0 11/03/2020 0911   CALCIUM 9.1 08/08/2015 1214   ALKPHOS 63 11/03/2020 0911   ALKPHOS 78 08/08/2015 1214   AST 12 (L) 11/03/2020 0911   AST 21 08/08/2015 1214   ALT 12 11/03/2020 0911   ALT 15 08/08/2015 1214   BILITOT 0.4 11/03/2020 0911   BILITOT <0.30 08/08/2015 1214       Impression and Plan: Regina Mayo is a very pleasant 40 yo [name] with iron deficiency anemia secondary to menorrhagia and malabsorption with celiac disease. She forgot about taking her folic acid for the last 2 months and will restart today.  B 12 injection given today and will continue her monthly regimen.  Iron studies are pending. We will replace if needed.  Follow-up in 2 months.  She can contact our office with any questions or concerns.   Lottie Dawson, NP 9/26/20222:40 PM

## 2021-04-28 LAB — FERRITIN: Ferritin: 7 ng/mL — ABNORMAL LOW (ref 11–307)

## 2021-04-28 LAB — IRON AND TIBC
Iron: 21 ug/dL — ABNORMAL LOW (ref 41–142)
Saturation Ratios: 5 % — ABNORMAL LOW (ref 21–57)
TIBC: 395 ug/dL (ref 236–444)
UIBC: 374 ug/dL (ref 120–384)

## 2021-05-04 ENCOUNTER — Telehealth: Payer: Self-pay | Admitting: *Deleted

## 2021-05-04 NOTE — Telephone Encounter (Signed)
Per Judson Roch (2) doses of IV Iron -  called and gave upcoming appointments confirmed

## 2021-05-25 ENCOUNTER — Inpatient Hospital Stay: Payer: BC Managed Care – PPO | Attending: Family

## 2021-05-25 ENCOUNTER — Other Ambulatory Visit: Payer: Self-pay

## 2021-05-25 VITALS — BP 118/77 | HR 76 | Temp 97.8°F | Resp 18

## 2021-05-25 DIAGNOSIS — N920 Excessive and frequent menstruation with regular cycle: Secondary | ICD-10-CM | POA: Insufficient documentation

## 2021-05-25 DIAGNOSIS — E538 Deficiency of other specified B group vitamins: Secondary | ICD-10-CM | POA: Diagnosis not present

## 2021-05-25 DIAGNOSIS — D508 Other iron deficiency anemias: Secondary | ICD-10-CM

## 2021-05-25 DIAGNOSIS — K909 Intestinal malabsorption, unspecified: Secondary | ICD-10-CM

## 2021-05-25 DIAGNOSIS — K900 Celiac disease: Secondary | ICD-10-CM

## 2021-05-25 DIAGNOSIS — D500 Iron deficiency anemia secondary to blood loss (chronic): Secondary | ICD-10-CM | POA: Diagnosis not present

## 2021-05-25 DIAGNOSIS — N921 Excessive and frequent menstruation with irregular cycle: Secondary | ICD-10-CM

## 2021-05-25 MED ORDER — NA FERRIC GLUC CPLX IN SUCROSE 12.5 MG/ML IV SOLN
125.0000 mg | Freq: Once | INTRAVENOUS | Status: AC
  Administered 2021-05-25: 125 mg via INTRAVENOUS
  Filled 2021-05-25: qty 125

## 2021-05-25 MED ORDER — DIPHENHYDRAMINE HCL 50 MG/ML IJ SOLN: 50.0000 mg | Freq: Once | INTRAMUSCULAR | Status: DC | PRN

## 2021-05-25 MED ORDER — EPINEPHRINE 0.3 MG/0.3ML IJ SOAJ: 0.3000 mg | Freq: Once | INTRAMUSCULAR | Status: DC | PRN

## 2021-05-25 MED ORDER — SODIUM CHLORIDE 0.9 % IV SOLN: Freq: Once | INTRAVENOUS | Status: DC | PRN

## 2021-05-25 MED ORDER — ALBUTEROL SULFATE (2.5 MG/3ML) 0.083% IN NEBU: 2.5000 mg | INHALATION_SOLUTION | Freq: Once | RESPIRATORY_TRACT | Status: DC | PRN

## 2021-05-25 MED ORDER — METHYLPREDNISOLONE SODIUM SUCC 125 MG IJ SOLR: 125.0000 mg | Freq: Once | INTRAMUSCULAR | Status: DC | PRN

## 2021-05-25 MED ORDER — SODIUM CHLORIDE 0.9% FLUSH: 10.0000 mL | Freq: Once | INTRAVENOUS | Status: DC | PRN

## 2021-05-25 MED ORDER — SODIUM CHLORIDE 0.9 % IV SOLN: Freq: Once | INTRAVENOUS | Status: AC

## 2021-05-25 MED ORDER — SODIUM CHLORIDE 0.9% FLUSH: 3.0000 mL | Freq: Once | INTRAVENOUS | Status: DC | PRN

## 2021-05-25 MED ORDER — CYANOCOBALAMIN 1000 MCG/ML IJ SOLN
1000.0000 ug | Freq: Once | INTRAMUSCULAR | Status: AC
  Administered 2021-05-25: 1000 ug via INTRAMUSCULAR
  Filled 2021-05-25: qty 1

## 2021-05-25 MED ORDER — FAMOTIDINE IN NACL 20-0.9 MG/50ML-% IV SOLN: 20.0000 mg | Freq: Once | INTRAVENOUS | Status: DC | PRN

## 2021-05-25 NOTE — Patient Instructions (Signed)
Sodium Ferric Gluconate Complex Injection What is this medication? SODIUM FERRIC GLUCONATE COMPLEX (SOE dee um FER ik GLOO koe nate KOM pleks) treats low levels of iron (iron deficiency anemia) in people with kidney disease. Iron is a mineral that plays an important role in making red blood cells, which carry oxygen from your lungs to the rest of your body. This medicine may be used for other purposes; ask your health care provider or pharmacist if you have questions. COMMON BRAND NAME(S): Ferrlecit, Nulecit What should I tell my care team before I take this medication? They need to know if you have any of the following conditions: Anemia that is not from iron deficiency High levels of iron in the blood An unusual or allergic reaction to iron, other medications, foods, dyes, or preservatives Pregnant or are trying to become pregnant Breast-feeding How should I use this medication? This medication is injected into a vein. It is given by your care team in a hospital or clinic setting. Talk to your care team about the use of this medication in children. While it may be prescribed for children as young as 6 years for selected conditions, precautions do apply. Overdosage: If you think you have taken too much of this medicine contact a poison control center or emergency room at once. NOTE: This medicine is only for you. Do not share this medicine with others. What if I miss a dose? It is important not to miss your dose. Call your care team if you are unable to keep an appointment. What may interact with this medication? Do not take this medication with any of the following: Deferasirox Deferoxamine Dimercaprol This medication may also interact with the following: Other iron products This list may not describe all possible interactions. Give your health care provider a list of all the medicines, herbs, non-prescription drugs, or dietary supplements you use. Also tell them if you smoke, drink  alcohol, or use illegal drugs. Some items may interact with your medicine. What should I watch for while using this medication? Your condition will be monitored carefully while you are receiving this medication. Visit your care team for regular checks on your progress. You may need blood work while you are taking this medication. What side effects may I notice from receiving this medication? Side effects that you should report to your care team as soon as possible: Allergic reactions-skin rash, itching, hives, swelling of the face, lips, tongue, or throat Low blood pressure-dizziness, feeling faint or lightheaded, blurry vision Shortness of breath Side effects that usually do not require medical attention (report to your care team if they continue or are bothersome): Flushing Headache Joint pain Muscle pain Nausea Pain, redness, or irritation at injection site This list may not describe all possible side effects. Call your doctor for medical advice about side effects. You may report side effects to FDA at 1-800-FDA-1088. Where should I keep my medication? This medication is given in a hospital or clinic and will not be stored at home. NOTE: This sheet is a summary. It may not cover all possible information. If you have questions about this medicine, talk to your doctor, pharmacist, or health care provider.  2022 Elsevier/Gold Standard (2020-09-01 11:46:34)

## 2021-05-27 ENCOUNTER — Inpatient Hospital Stay: Payer: BC Managed Care – PPO

## 2021-06-01 ENCOUNTER — Other Ambulatory Visit: Payer: Self-pay

## 2021-06-01 ENCOUNTER — Inpatient Hospital Stay: Payer: BC Managed Care – PPO

## 2021-06-01 VITALS — BP 120/82 | HR 69 | Temp 98.0°F | Resp 16

## 2021-06-01 DIAGNOSIS — N921 Excessive and frequent menstruation with irregular cycle: Secondary | ICD-10-CM

## 2021-06-01 DIAGNOSIS — K909 Intestinal malabsorption, unspecified: Secondary | ICD-10-CM

## 2021-06-01 DIAGNOSIS — D500 Iron deficiency anemia secondary to blood loss (chronic): Secondary | ICD-10-CM | POA: Diagnosis not present

## 2021-06-01 DIAGNOSIS — E538 Deficiency of other specified B group vitamins: Secondary | ICD-10-CM | POA: Diagnosis not present

## 2021-06-01 DIAGNOSIS — K900 Celiac disease: Secondary | ICD-10-CM

## 2021-06-01 DIAGNOSIS — N920 Excessive and frequent menstruation with regular cycle: Secondary | ICD-10-CM | POA: Diagnosis not present

## 2021-06-01 DIAGNOSIS — D508 Other iron deficiency anemias: Secondary | ICD-10-CM

## 2021-06-01 MED ORDER — SODIUM CHLORIDE 0.9 % IV SOLN
125.0000 mg | Freq: Once | INTRAVENOUS | Status: AC
  Administered 2021-06-01: 125 mg via INTRAVENOUS
  Filled 2021-06-01: qty 125

## 2021-06-01 MED ORDER — SODIUM CHLORIDE 0.9 % IV SOLN: Freq: Once | INTRAVENOUS | Status: AC

## 2021-06-01 NOTE — Patient Instructions (Signed)
Sodium Ferric Gluconate Complex Injection What is this medication? SODIUM FERRIC GLUCONATE COMPLEX (SOE dee um FER ik GLOO koe nate KOM pleks) treats low levels of iron (iron deficiency anemia) in people with kidney disease. Iron is a mineral that plays an important role in making red blood cells, which carry oxygen from your lungs to the rest of your body. This medicine may be used for other purposes; ask your health care provider or pharmacist if you have questions. COMMON BRAND NAME(S): Ferrlecit, Nulecit What should I tell my care team before I take this medication? They need to know if you have any of the following conditions: Anemia that is not from iron deficiency High levels of iron in the blood An unusual or allergic reaction to iron, other medications, foods, dyes, or preservatives Pregnant or are trying to become pregnant Breast-feeding How should I use this medication? This medication is injected into a vein. It is given by your care team in a hospital or clinic setting. Talk to your care team about the use of this medication in children. While it may be prescribed for children as young as 6 years for selected conditions, precautions do apply. Overdosage: If you think you have taken too much of this medicine contact a poison control center or emergency room at once. NOTE: This medicine is only for you. Do not share this medicine with others. What if I miss a dose? It is important not to miss your dose. Call your care team if you are unable to keep an appointment. What may interact with this medication? Do not take this medication with any of the following: Deferasirox Deferoxamine Dimercaprol This medication may also interact with the following: Other iron products This list may not describe all possible interactions. Give your health care provider a list of all the medicines, herbs, non-prescription drugs, or dietary supplements you use. Also tell them if you smoke, drink  alcohol, or use illegal drugs. Some items may interact with your medicine. What should I watch for while using this medication? Your condition will be monitored carefully while you are receiving this medication. Visit your care team for regular checks on your progress. You may need blood work while you are taking this medication. What side effects may I notice from receiving this medication? Side effects that you should report to your care team as soon as possible: Allergic reactions-skin rash, itching, hives, swelling of the face, lips, tongue, or throat Low blood pressure-dizziness, feeling faint or lightheaded, blurry vision Shortness of breath Side effects that usually do not require medical attention (report to your care team if they continue or are bothersome): Flushing Headache Joint pain Muscle pain Nausea Pain, redness, or irritation at injection site This list may not describe all possible side effects. Call your doctor for medical advice about side effects. You may report side effects to FDA at 1-800-FDA-1088. Where should I keep my medication? This medication is given in a hospital or clinic and will not be stored at home. NOTE: This sheet is a summary. It may not cover all possible information. If you have questions about this medicine, talk to your doctor, pharmacist, or health care provider.  2022 Elsevier/Gold Standard (2020-09-01 11:46:34)

## 2021-06-02 DIAGNOSIS — D649 Anemia, unspecified: Secondary | ICD-10-CM | POA: Diagnosis not present

## 2021-06-02 DIAGNOSIS — E559 Vitamin D deficiency, unspecified: Secondary | ICD-10-CM | POA: Diagnosis not present

## 2021-06-02 DIAGNOSIS — E039 Hypothyroidism, unspecified: Secondary | ICD-10-CM | POA: Diagnosis not present

## 2021-06-09 DIAGNOSIS — E559 Vitamin D deficiency, unspecified: Secondary | ICD-10-CM | POA: Diagnosis not present

## 2021-06-09 DIAGNOSIS — D649 Anemia, unspecified: Secondary | ICD-10-CM | POA: Diagnosis not present

## 2021-06-09 DIAGNOSIS — K900 Celiac disease: Secondary | ICD-10-CM | POA: Diagnosis not present

## 2021-06-09 DIAGNOSIS — E039 Hypothyroidism, unspecified: Secondary | ICD-10-CM | POA: Diagnosis not present

## 2021-06-29 ENCOUNTER — Inpatient Hospital Stay (HOSPITAL_BASED_OUTPATIENT_CLINIC_OR_DEPARTMENT_OTHER): Payer: BC Managed Care – PPO | Admitting: Family

## 2021-06-29 ENCOUNTER — Encounter: Payer: Self-pay | Admitting: Family

## 2021-06-29 ENCOUNTER — Inpatient Hospital Stay: Payer: BC Managed Care – PPO | Attending: Family

## 2021-06-29 ENCOUNTER — Other Ambulatory Visit: Payer: Self-pay

## 2021-06-29 ENCOUNTER — Ambulatory Visit: Payer: BC Managed Care – PPO

## 2021-06-29 VITALS — BP 120/81 | HR 66 | Temp 98.4°F | Resp 17 | Wt 163.1 lb

## 2021-06-29 DIAGNOSIS — K909 Intestinal malabsorption, unspecified: Secondary | ICD-10-CM

## 2021-06-29 DIAGNOSIS — D500 Iron deficiency anemia secondary to blood loss (chronic): Secondary | ICD-10-CM | POA: Insufficient documentation

## 2021-06-29 DIAGNOSIS — N921 Excessive and frequent menstruation with irregular cycle: Secondary | ICD-10-CM

## 2021-06-29 DIAGNOSIS — E538 Deficiency of other specified B group vitamins: Secondary | ICD-10-CM | POA: Diagnosis not present

## 2021-06-29 DIAGNOSIS — K900 Celiac disease: Secondary | ICD-10-CM | POA: Insufficient documentation

## 2021-06-29 DIAGNOSIS — D508 Other iron deficiency anemias: Secondary | ICD-10-CM

## 2021-06-29 DIAGNOSIS — N920 Excessive and frequent menstruation with regular cycle: Secondary | ICD-10-CM | POA: Insufficient documentation

## 2021-06-29 LAB — RETICULOCYTES
Immature Retic Fract: 18.8 % — ABNORMAL HIGH (ref 2.3–15.9)
RBC.: 4.09 MIL/uL (ref 3.87–5.11)
Retic Count, Absolute: 46.2 10*3/uL (ref 19.0–186.0)
Retic Ct Pct: 1.1 % (ref 0.4–3.1)

## 2021-06-29 LAB — CBC WITH DIFFERENTIAL (CANCER CENTER ONLY)
Abs Immature Granulocytes: 0.01 10*3/uL (ref 0.00–0.07)
Basophils Absolute: 0.1 10*3/uL (ref 0.0–0.1)
Basophils Relative: 1 %
Eosinophils Absolute: 0.2 10*3/uL (ref 0.0–0.5)
Eosinophils Relative: 4 %
HCT: 29.8 % — ABNORMAL LOW (ref 36.0–46.0)
Hemoglobin: 8.9 g/dL — ABNORMAL LOW (ref 12.0–15.0)
Immature Granulocytes: 0 %
Lymphocytes Relative: 27 %
Lymphs Abs: 1.1 10*3/uL (ref 0.7–4.0)
MCH: 22.1 pg — ABNORMAL LOW (ref 26.0–34.0)
MCHC: 29.9 g/dL — ABNORMAL LOW (ref 30.0–36.0)
MCV: 73.9 fL — ABNORMAL LOW (ref 80.0–100.0)
Monocytes Absolute: 0.4 10*3/uL (ref 0.1–1.0)
Monocytes Relative: 9 %
Neutro Abs: 2.4 10*3/uL (ref 1.7–7.7)
Neutrophils Relative %: 59 %
Platelet Count: 152 10*3/uL (ref 150–400)
RBC: 4.03 MIL/uL (ref 3.87–5.11)
RDW: 19.9 % — ABNORMAL HIGH (ref 11.5–15.5)
WBC Count: 4.1 10*3/uL (ref 4.0–10.5)
nRBC: 0 % (ref 0.0–0.2)

## 2021-06-29 LAB — VITAMIN B12: Vitamin B-12: 507 pg/mL (ref 180–914)

## 2021-06-29 NOTE — Progress Notes (Signed)
Hematology and Oncology Follow Up Visit  Regina Mayo [id number] Oct 08, 1980 40 y.o. 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 She is doing fairly well but notes fatigue and mild SOB with exertion.  Her cycle is regular but heavy for 4 out of 7 days. She states that she has contacted her gynecologist and has an appointment coming up at the end of December for follow-up.  No other blood loss noted. No bruising or petechiae.  She states that she is now doing her B 12 injections herself at home and her endocrinologist is monitoring.  No fever, chills, n/v, cough, rash, dizziness, chest pain, palpitations, abdominal pain or changes in bowel or bladder habits.  No swelling, tenderness, numbness or tingling in her extremities.  No falls or syncope.  She has a good appetite but admits that she needs to better hydrate throughout the day.  Her weight is stable at 163 lbs.   ECOG Performance Status: 1 - Symptomatic but completely ambulatory  Medications:  Allergies as of 06/29/2021   No Known Allergies      Medication List        Accurate as of June 29, 2021  3:22 PM. If you have any questions, ask your nurse or doctor.          EPINEPHrine 0.3 mg/0.3 mL Soaj injection Commonly known as: EPI-PEN Inject 0.3 mLs (0.3 mg total) into the muscle as needed for anaphylaxis.   fluticasone 50 MCG/ACT nasal spray Commonly known as: FLONASE Place 2 sprays into both nostrils daily. As needed   folic acid 1 MG tablet Commonly known as: FOLVITE Take 1 tablet (1 mg total) by mouth daily.   levothyroxine 50 MCG tablet Commonly known as: SYNTHROID    loratadine-pseudoephedrine 5-120 MG tablet Commonly known as: CLARITIN-D 12-hour Take 1 tablet by mouth 2 (two) times daily.   traZODone 50 MG tablet Commonly known as: DESYREL Take 50 mg by mouth at bedtime as needed.   Trintellix 20 MG Tabs tablet Generic drug: vortioxetine HBr Take 20 mg by mouth daily.   vitamin B-12 100 MCG tablet Commonly known as: CYANOCOBALAMIN Take 100 mcg by mouth daily.   cyanocobalamin 1000 MCG/ML injection Commonly known as: (VITAMIN B-12) Inject into the muscle once a week. Once a week x 4 weeks then once a month   Vitamin D (Ergocalciferol) 1.25 MG (50000 UNIT) Caps capsule Commonly known as: DRISDOL Take 50,000 Units by mouth every 7 (seven) days.   VITAMIN D3 PO        Allergies: No Known Allergies  Past Medical History, Surgical history, Social history, and Family History were reviewed and updated.  Review of Systems: All other 10 point review of systems is negative.   Physical Exam:  weight is 163 lb 1.9 oz (74 kg). Her oral temperature is 98.4 F (36.9 C). Her blood pressure is 120/81 and her pulse is 66. Her respiration is 17 and oxygen saturation is 100%.   Wt Readings from Last 3 Encounters:  06/29/21 163 lb 1.9 oz (74 kg)  04/27/21 163 lb 12.8 oz (74.3 kg)  02/24/21 167 lb 6.4 oz (75.9  kg)    Ocular: Sclerae unicteric, pupils equal, round and reactive to light Ear-nose-throat: Oropharynx clear, dentition fair Lymphatic: No cervical or supraclavicular adenopathy Lungs no rales or rhonchi, good excursion bilaterally Heart regular rate and rhythm, no murmur appreciated Abd soft, nontender, positive bowel sounds MSK no focal spinal tenderness, no joint edema Neuro: non-focal, well-oriented, appropriate affect Breasts: Deferred   Lab Results  Component Value Date   WBC 4.1 06/29/2021   HGB 8.9 (L) 06/29/2021   HCT 29.8 (L) 06/29/2021   MCV 73.9 (L) 06/29/2021   PLT 152 06/29/2021   Lab Results  Component Value  Date   FERRITIN 7 (L) 04/27/2021   IRON 21 (L) 04/27/2021   TIBC 395 04/27/2021   UIBC 374 04/27/2021   IRONPCTSAT 5 (L) 04/27/2021   Lab Results  Component Value Date   RETICCTPCT 1.1 06/29/2021   RBC 4.09 06/29/2021   RETICCTABS 58.1 08/08/2015   No results found for: KPAFRELGTCHN, LAMBDASER, KAPLAMBRATIO No results found for: IGGSERUM, IGA, IGMSERUM No results found for: Regina Mayo, SPEI   Chemistry      Component Value Date/Time   NA 140 11/03/2020 0911   NA 138 08/08/2015 1214   K 3.9 11/03/2020 0911   K 4.1 08/08/2015 1214   CL 107 11/03/2020 0911   CL 101 01/22/2015 1412   CO2 26 11/03/2020 0911   CO2 26 08/08/2015 1214   BUN 12 11/03/2020 0911   BUN 17.2 08/08/2015 1214   CREATININE 0.83 11/03/2020 0911   CREATININE 0.8 08/08/2015 1214      Component Value Date/Time   CALCIUM 9.0 11/03/2020 0911   CALCIUM 9.1 08/08/2015 1214   ALKPHOS 63 11/03/2020 0911   ALKPHOS 78 08/08/2015 1214   AST 12 (L) 11/03/2020 0911   AST 21 08/08/2015 1214   ALT 12 11/03/2020 0911   ALT 15 08/08/2015 1214   BILITOT 0.4 11/03/2020 0911   BILITOT <0.30 08/08/2015 1214       Impression and Plan: Regina Mayo is a very pleasant 40 yo [name] with iron deficiency anemia secondary to menorrhagia and malabsorption with celiac disease. Iron studies are pending. We will replace if needed.  Follow-up in 8 weeks.  She can contact our office with any questions or concerns.   Regina Dawson, NP 11/28/20223:22 PM

## 2021-06-30 LAB — IRON AND TIBC
Iron: 21 ug/dL — ABNORMAL LOW (ref 28–170)
Saturation Ratios: 5 % — ABNORMAL LOW (ref 10.4–31.8)
TIBC: 464 ug/dL — ABNORMAL HIGH (ref 250–450)
UIBC: 443 ug/dL

## 2021-06-30 LAB — FERRITIN: Ferritin: 4 ng/mL — ABNORMAL LOW (ref 11–307)

## 2021-07-01 ENCOUNTER — Telehealth: Payer: Self-pay | Admitting: *Deleted

## 2021-07-01 ENCOUNTER — Other Ambulatory Visit: Payer: Self-pay | Admitting: Family

## 2021-07-01 NOTE — Telephone Encounter (Signed)
Per 06/29/21 los - called and gave upcoming appointments - confirmed

## 2021-07-06 ENCOUNTER — Telehealth: Payer: Self-pay | Admitting: *Deleted

## 2021-07-06 NOTE — Telephone Encounter (Signed)
Per staff message Judson Roch - called to schedule (3) doses of IV Iron - requested call back

## 2021-07-08 ENCOUNTER — Telehealth: Payer: Self-pay | Admitting: *Deleted

## 2021-07-08 NOTE — Telephone Encounter (Signed)
Per staff message Judson Roch - called and gave upcoming appointments - confirmed (3) doses of IV Iron

## 2021-07-09 ENCOUNTER — Other Ambulatory Visit: Payer: Self-pay

## 2021-07-09 ENCOUNTER — Inpatient Hospital Stay: Payer: BC Managed Care – PPO | Attending: Family

## 2021-07-09 VITALS — BP 130/65 | HR 69 | Temp 96.1°F

## 2021-07-09 DIAGNOSIS — N920 Excessive and frequent menstruation with regular cycle: Secondary | ICD-10-CM | POA: Diagnosis not present

## 2021-07-09 DIAGNOSIS — D500 Iron deficiency anemia secondary to blood loss (chronic): Secondary | ICD-10-CM | POA: Diagnosis not present

## 2021-07-09 DIAGNOSIS — K900 Celiac disease: Secondary | ICD-10-CM

## 2021-07-09 DIAGNOSIS — E538 Deficiency of other specified B group vitamins: Secondary | ICD-10-CM | POA: Diagnosis not present

## 2021-07-09 DIAGNOSIS — D508 Other iron deficiency anemias: Secondary | ICD-10-CM

## 2021-07-09 DIAGNOSIS — K909 Intestinal malabsorption, unspecified: Secondary | ICD-10-CM | POA: Diagnosis not present

## 2021-07-09 DIAGNOSIS — N921 Excessive and frequent menstruation with irregular cycle: Secondary | ICD-10-CM

## 2021-07-09 MED ORDER — SODIUM CHLORIDE 0.9 % IV SOLN: Freq: Once | INTRAVENOUS | Status: AC

## 2021-07-09 MED ORDER — SODIUM CHLORIDE 0.9 % IV SOLN
300.0000 mg | Freq: Once | INTRAVENOUS | Status: AC
  Administered 2021-07-09: 300 mg via INTRAVENOUS
  Filled 2021-07-09: qty 300

## 2021-07-09 NOTE — Patient Instructions (Signed)

## 2021-07-16 ENCOUNTER — Inpatient Hospital Stay: Payer: BC Managed Care – PPO

## 2021-07-16 ENCOUNTER — Other Ambulatory Visit: Payer: Self-pay

## 2021-07-16 VITALS — BP 109/68 | HR 81 | Temp 98.0°F | Resp 16

## 2021-07-16 DIAGNOSIS — N920 Excessive and frequent menstruation with regular cycle: Secondary | ICD-10-CM | POA: Diagnosis not present

## 2021-07-16 DIAGNOSIS — N921 Excessive and frequent menstruation with irregular cycle: Secondary | ICD-10-CM

## 2021-07-16 DIAGNOSIS — K909 Intestinal malabsorption, unspecified: Secondary | ICD-10-CM | POA: Diagnosis not present

## 2021-07-16 DIAGNOSIS — D500 Iron deficiency anemia secondary to blood loss (chronic): Secondary | ICD-10-CM | POA: Diagnosis not present

## 2021-07-16 DIAGNOSIS — E538 Deficiency of other specified B group vitamins: Secondary | ICD-10-CM | POA: Diagnosis not present

## 2021-07-16 DIAGNOSIS — K900 Celiac disease: Secondary | ICD-10-CM

## 2021-07-16 DIAGNOSIS — D508 Other iron deficiency anemias: Secondary | ICD-10-CM

## 2021-07-16 MED ORDER — SODIUM CHLORIDE 0.9 % IV SOLN
300.0000 mg | Freq: Once | INTRAVENOUS | Status: AC
  Administered 2021-07-16: 300 mg via INTRAVENOUS
  Filled 2021-07-16: qty 300

## 2021-07-16 MED ORDER — SODIUM CHLORIDE 0.9 % IV SOLN: Freq: Once | INTRAVENOUS | Status: AC

## 2021-07-16 NOTE — Patient Instructions (Signed)

## 2021-07-23 ENCOUNTER — Other Ambulatory Visit: Payer: Self-pay

## 2021-07-23 ENCOUNTER — Inpatient Hospital Stay: Payer: BC Managed Care – PPO

## 2021-07-23 ENCOUNTER — Ambulatory Visit: Payer: BC Managed Care – PPO

## 2021-07-23 VITALS — BP 120/63 | HR 68 | Temp 99.0°F | Resp 18

## 2021-07-23 DIAGNOSIS — K909 Intestinal malabsorption, unspecified: Secondary | ICD-10-CM

## 2021-07-23 DIAGNOSIS — D508 Other iron deficiency anemias: Secondary | ICD-10-CM

## 2021-07-23 DIAGNOSIS — K900 Celiac disease: Secondary | ICD-10-CM

## 2021-07-23 DIAGNOSIS — D500 Iron deficiency anemia secondary to blood loss (chronic): Secondary | ICD-10-CM | POA: Diagnosis not present

## 2021-07-23 DIAGNOSIS — E538 Deficiency of other specified B group vitamins: Secondary | ICD-10-CM | POA: Diagnosis not present

## 2021-07-23 DIAGNOSIS — N920 Excessive and frequent menstruation with regular cycle: Secondary | ICD-10-CM | POA: Diagnosis not present

## 2021-07-23 DIAGNOSIS — N921 Excessive and frequent menstruation with irregular cycle: Secondary | ICD-10-CM

## 2021-07-23 MED ORDER — SODIUM CHLORIDE 0.9 % IV SOLN: Freq: Once | INTRAVENOUS | Status: AC

## 2021-07-23 MED ORDER — SODIUM CHLORIDE 0.9 % IV SOLN
300.0000 mg | Freq: Once | INTRAVENOUS | Status: AC
  Administered 2021-07-23: 09:00:00 300 mg via INTRAVENOUS
  Filled 2021-07-23: qty 300

## 2021-07-23 NOTE — Patient Instructions (Signed)

## 2021-07-28 DIAGNOSIS — D509 Iron deficiency anemia, unspecified: Secondary | ICD-10-CM | POA: Diagnosis not present

## 2021-07-28 DIAGNOSIS — N920 Excessive and frequent menstruation with regular cycle: Secondary | ICD-10-CM | POA: Diagnosis not present

## 2021-07-28 DIAGNOSIS — E039 Hypothyroidism, unspecified: Secondary | ICD-10-CM | POA: Diagnosis not present

## 2021-08-13 DIAGNOSIS — N920 Excessive and frequent menstruation with regular cycle: Secondary | ICD-10-CM | POA: Diagnosis not present

## 2021-08-31 ENCOUNTER — Other Ambulatory Visit: Payer: Self-pay

## 2021-08-31 ENCOUNTER — Encounter: Payer: Self-pay | Admitting: Family

## 2021-08-31 ENCOUNTER — Inpatient Hospital Stay: Payer: BC Managed Care – PPO | Admitting: Family

## 2021-08-31 ENCOUNTER — Inpatient Hospital Stay: Payer: BC Managed Care – PPO | Attending: Family

## 2021-08-31 VITALS — BP 119/72 | HR 64 | Temp 97.9°F | Resp 18 | Ht 66.0 in | Wt 168.0 lb

## 2021-08-31 DIAGNOSIS — N920 Excessive and frequent menstruation with regular cycle: Secondary | ICD-10-CM | POA: Insufficient documentation

## 2021-08-31 DIAGNOSIS — K900 Celiac disease: Secondary | ICD-10-CM | POA: Diagnosis not present

## 2021-08-31 DIAGNOSIS — E538 Deficiency of other specified B group vitamins: Secondary | ICD-10-CM | POA: Diagnosis not present

## 2021-08-31 DIAGNOSIS — D508 Other iron deficiency anemias: Secondary | ICD-10-CM

## 2021-08-31 DIAGNOSIS — K909 Intestinal malabsorption, unspecified: Secondary | ICD-10-CM | POA: Diagnosis not present

## 2021-08-31 DIAGNOSIS — D500 Iron deficiency anemia secondary to blood loss (chronic): Secondary | ICD-10-CM | POA: Insufficient documentation

## 2021-08-31 LAB — CBC WITH DIFFERENTIAL (CANCER CENTER ONLY)
Abs Immature Granulocytes: 0.03 10*3/uL (ref 0.00–0.07)
Basophils Absolute: 0.1 10*3/uL (ref 0.0–0.1)
Basophils Relative: 2 %
Eosinophils Absolute: 0.1 10*3/uL (ref 0.0–0.5)
Eosinophils Relative: 3 %
HCT: 37.5 % (ref 36.0–46.0)
Hemoglobin: 11.7 g/dL — ABNORMAL LOW (ref 12.0–15.0)
Immature Granulocytes: 1 %
Lymphocytes Relative: 27 %
Lymphs Abs: 1.1 10*3/uL (ref 0.7–4.0)
MCH: 25.3 pg — ABNORMAL LOW (ref 26.0–34.0)
MCHC: 31.2 g/dL (ref 30.0–36.0)
MCV: 81.2 fL (ref 80.0–100.0)
Monocytes Absolute: 0.4 10*3/uL (ref 0.1–1.0)
Monocytes Relative: 9 %
Neutro Abs: 2.4 10*3/uL (ref 1.7–7.7)
Neutrophils Relative %: 58 %
Platelet Count: 179 10*3/uL (ref 150–400)
RBC: 4.62 MIL/uL (ref 3.87–5.11)
RDW: 19.9 % — ABNORMAL HIGH (ref 11.5–15.5)
WBC Count: 4.1 10*3/uL (ref 4.0–10.5)
nRBC: 0 % (ref 0.0–0.2)

## 2021-08-31 LAB — RETICULOCYTES
Immature Retic Fract: 10.9 % (ref 2.3–15.9)
RBC.: 4.59 MIL/uL (ref 3.87–5.11)
Retic Count, Absolute: 62 10*3/uL (ref 19.0–186.0)
Retic Ct Pct: 1.4 % (ref 0.4–3.1)

## 2021-08-31 LAB — VITAMIN B12: Vitamin B-12: 273 pg/mL (ref 180–914)

## 2021-08-31 NOTE — Progress Notes (Signed)
Hematology and Oncology Follow Up Visit  Regina Mayo [id number] [birth year] 41 y.o. 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 She is symptomatic with fatigue, weakness and SOB with exertion.  Hgb is up to 11.7, MCV 81, platelets 179 and WBC count 4.1.  She states that her cycle has been lasting 2 days but she still has lots of clots. She was able to see her gynecologist last month and states that she had a recent uterine biopsy. She is waiting on results from this before deciding on birth control vs uterine ablation.  No other blood loss noted. No bruising or petechiae.  No fever, chills, n/v, cough, rash, dizziness, palpitations, abdominal pain or changes in bowel or bladder habits.  No swelling, tenderness, numbness or tingling in her extremities at this time.  No falls or syncope to report.  She has been eating well but admits that she needs to better hydrate throughout the day. Her weight is stable at 168 lbs.   ECOG Performance Status: 1 - Symptomatic but completely ambulatory  Medications:  Allergies as of 08/31/2021   No Known Allergies      Medication List        Accurate as of August 31, 2021  2:32 PM. If you have any questions, ask your nurse or doctor.          EPINEPHrine 0.3 mg/0.3 mL Soaj injection Commonly known as: EPI-PEN Inject 0.3 mLs (0.3 mg total) into the muscle as needed for anaphylaxis.   fluticasone 50 MCG/ACT nasal spray Commonly known as: FLONASE Place 2 sprays into both nostrils daily. As needed   folic acid 1 MG tablet Commonly known as: FOLVITE Take 1 tablet (1 mg total) by mouth daily.   levothyroxine  50 MCG tablet Commonly known as: SYNTHROID   loratadine-pseudoephedrine 5-120 MG tablet Commonly known as: CLARITIN-D 12-hour Take 1 tablet by mouth 2 (two) times daily.   traZODone 50 MG tablet Commonly known as: DESYREL Take 50 mg by mouth at bedtime as needed.   Trintellix 20 MG Tabs tablet Generic drug: vortioxetine HBr Take 20 mg by mouth daily.   vitamin B-12 100 MCG tablet Commonly known as: CYANOCOBALAMIN Take 100 mcg by mouth daily.   cyanocobalamin 1000 MCG/ML injection Commonly known as: (VITAMIN B-12) Inject into the muscle once a week. Once a week x 4 weeks then once a month   Vitamin D (Ergocalciferol) 1.25 MG (50000 UNIT) Caps capsule Commonly known as: DRISDOL Take 50,000 Units by mouth every 7 (seven) days.   VITAMIN D3 PO        Allergies: No Known Allergies  Past Medical History, Surgical history, Social history, and Family History were reviewed and updated.  Review of Systems: All other 10 point review of systems is negative.   Physical Exam:  vitals were not taken for this visit.   Wt Readings from Last 3 Encounters:  06/29/21 163 lb 1.9 oz (74 kg)  04/27/21 163 lb 12.8 oz (74.3 kg)  02/24/21 167 lb 6.4 oz (75.9 kg)    Ocular: Sclerae unicteric, pupils equal, round and reactive to light Ear-nose-throat: Oropharynx clear, dentition  fair Lymphatic: No cervical or supraclavicular adenopathy Lungs no rales or rhonchi, good excursion bilaterally Heart regular rate and rhythm, no murmur appreciated Abd soft, nontender, positive bowel sounds MSK no focal spinal tenderness, no joint edema Neuro: non-focal, well-oriented, appropriate affect Breasts: Deferred   Lab Results  Component Value Date   WBC 4.1 06/29/2021   HGB 8.9 (L) 06/29/2021   HCT 29.8 (L) 06/29/2021   MCV 73.9 (L) 06/29/2021   PLT 152 06/29/2021   Lab Results  Component Value Date   FERRITIN 4 (L) 06/29/2021   IRON 21 (L) 06/29/2021   TIBC 464 (H) 06/29/2021   UIBC 443  06/29/2021   IRONPCTSAT 5 (L) 06/29/2021   Lab Results  Component Value Date   RETICCTPCT 1.1 06/29/2021   RBC 4.09 06/29/2021   RETICCTABS 58.1 08/08/2015   No results found for: KPAFRELGTCHN, LAMBDASER, KAPLAMBRATIO No results found for: IGGSERUM, IGA, IGMSERUM No results found for: Odetta Pink, SPEI   Chemistry      Component Value Date/Time   NA 140 11/03/2020 0911   NA 138 08/08/2015 1214   K 3.9 11/03/2020 0911   K 4.1 08/08/2015 1214   CL 107 11/03/2020 0911   CL 101 01/22/2015 1412   CO2 26 11/03/2020 0911   CO2 26 08/08/2015 1214   BUN 12 11/03/2020 0911   BUN 17.2 08/08/2015 1214   CREATININE 0.83 11/03/2020 0911   CREATININE 0.8 08/08/2015 1214      Component Value Date/Time   CALCIUM 9.0 11/03/2020 0911   CALCIUM 9.1 08/08/2015 1214   ALKPHOS 63 11/03/2020 0911   ALKPHOS 78 08/08/2015 1214   AST 12 (L) 11/03/2020 0911   AST 21 08/08/2015 1214   ALT 12 11/03/2020 0911   ALT 15 08/08/2015 1214   BILITOT 0.4 11/03/2020 0911   BILITOT <0.30 08/08/2015 1214       Impression and Plan: Regina Mayo is a very pleasant 41 yo [name] with iron deficiency anemia secondary to menorrhagia and malabsorption with celiac disease.  Iron studies are pending. We will replace if needed.  Lab only in 8 weeks and follow-up in 4 months.   Regina Dawson, NP 1/30/20232:32 PM

## 2021-09-01 ENCOUNTER — Encounter: Payer: Self-pay | Admitting: *Deleted

## 2021-09-01 ENCOUNTER — Telehealth: Payer: Self-pay | Admitting: Hematology & Oncology

## 2021-09-01 LAB — FERRITIN: Ferritin: 40 ng/mL (ref 11–307)

## 2021-09-01 LAB — IRON AND IRON BINDING CAPACITY (CC-WL,HP ONLY)
Iron: 47 ug/dL (ref 28–170)
Saturation Ratios: 11 % (ref 10.4–31.8)
TIBC: 423 ug/dL (ref 250–450)
UIBC: 376 ug/dL (ref 148–442)

## 2021-09-01 NOTE — Telephone Encounter (Signed)
Called to schedule 1 dose of IV iron (Feraheme), left voicemail

## 2021-09-02 ENCOUNTER — Telehealth: Payer: Self-pay | Admitting: *Deleted

## 2021-09-02 NOTE — Telephone Encounter (Signed)
Per 08/31/21 los - called and gave upcoming appointments - confirmed - also per scheduling message (1) dose of IV Iron (Feraheme)

## 2021-09-21 ENCOUNTER — Ambulatory Visit: Payer: BC Managed Care – PPO

## 2021-09-28 ENCOUNTER — Inpatient Hospital Stay: Payer: BC Managed Care – PPO | Attending: Family

## 2021-09-28 ENCOUNTER — Other Ambulatory Visit: Payer: Self-pay

## 2021-09-28 VITALS — BP 121/81 | HR 57 | Temp 98.1°F | Resp 17

## 2021-09-28 DIAGNOSIS — D508 Other iron deficiency anemias: Secondary | ICD-10-CM

## 2021-09-28 DIAGNOSIS — N921 Excessive and frequent menstruation with irregular cycle: Secondary | ICD-10-CM

## 2021-09-28 DIAGNOSIS — D500 Iron deficiency anemia secondary to blood loss (chronic): Secondary | ICD-10-CM | POA: Diagnosis not present

## 2021-09-28 DIAGNOSIS — N920 Excessive and frequent menstruation with regular cycle: Secondary | ICD-10-CM | POA: Insufficient documentation

## 2021-09-28 DIAGNOSIS — K900 Celiac disease: Secondary | ICD-10-CM

## 2021-09-28 DIAGNOSIS — K909 Intestinal malabsorption, unspecified: Secondary | ICD-10-CM

## 2021-09-28 MED ORDER — SODIUM CHLORIDE 0.9 % IV SOLN
300.0000 mg | Freq: Once | INTRAVENOUS | Status: AC
  Administered 2021-09-28: 300 mg via INTRAVENOUS
  Filled 2021-09-28: qty 300

## 2021-09-28 MED ORDER — SODIUM CHLORIDE 0.9 % IV SOLN: Freq: Once | INTRAVENOUS | Status: AC

## 2021-09-28 NOTE — Progress Notes (Signed)
Pt declined to stay for post infusion observation period. Pt stated she has tolerated medication multiple times prior without difficulty. Pt aware to call clinic with any questions or concerns. Pt verbalized understanding and had no further questions.  ? ?

## 2021-09-28 NOTE — Patient Instructions (Signed)

## 2021-10-26 ENCOUNTER — Inpatient Hospital Stay: Payer: BC Managed Care – PPO | Attending: Family

## 2021-10-26 ENCOUNTER — Other Ambulatory Visit: Payer: BC Managed Care – PPO

## 2021-10-26 ENCOUNTER — Other Ambulatory Visit: Payer: Self-pay

## 2021-10-26 DIAGNOSIS — D508 Other iron deficiency anemias: Secondary | ICD-10-CM

## 2021-10-26 DIAGNOSIS — N920 Excessive and frequent menstruation with regular cycle: Secondary | ICD-10-CM | POA: Diagnosis not present

## 2021-10-26 DIAGNOSIS — K909 Intestinal malabsorption, unspecified: Secondary | ICD-10-CM

## 2021-10-26 DIAGNOSIS — E538 Deficiency of other specified B group vitamins: Secondary | ICD-10-CM | POA: Insufficient documentation

## 2021-10-26 DIAGNOSIS — D500 Iron deficiency anemia secondary to blood loss (chronic): Secondary | ICD-10-CM | POA: Insufficient documentation

## 2021-10-26 LAB — CBC WITH DIFFERENTIAL (CANCER CENTER ONLY)
Abs Immature Granulocytes: 0.01 10*3/uL (ref 0.00–0.07)
Basophils Absolute: 0.1 10*3/uL (ref 0.0–0.1)
Basophils Relative: 2 %
Eosinophils Absolute: 0.2 10*3/uL (ref 0.0–0.5)
Eosinophils Relative: 5 %
HCT: 37.5 % (ref 36.0–46.0)
Hemoglobin: 12 g/dL (ref 12.0–15.0)
Immature Granulocytes: 0 %
Lymphocytes Relative: 33 %
Lymphs Abs: 1.1 10*3/uL (ref 0.7–4.0)
MCH: 26.5 pg (ref 26.0–34.0)
MCHC: 32 g/dL (ref 30.0–36.0)
MCV: 83 fL (ref 80.0–100.0)
Monocytes Absolute: 0.3 10*3/uL (ref 0.1–1.0)
Monocytes Relative: 9 %
Neutro Abs: 1.6 10*3/uL — ABNORMAL LOW (ref 1.7–7.7)
Neutrophils Relative %: 51 %
Platelet Count: 137 10*3/uL — ABNORMAL LOW (ref 150–400)
RBC: 4.52 MIL/uL (ref 3.87–5.11)
RDW: 14.9 % (ref 11.5–15.5)
WBC Count: 3.2 10*3/uL — ABNORMAL LOW (ref 4.0–10.5)
nRBC: 0 % (ref 0.0–0.2)

## 2021-10-26 LAB — IRON AND IRON BINDING CAPACITY (CC-WL,HP ONLY)
Iron: 97 ug/dL (ref 28–170)
Saturation Ratios: 24 % (ref 10.4–31.8)
TIBC: 409 ug/dL (ref 250–450)
UIBC: 312 ug/dL (ref 148–442)

## 2021-10-26 LAB — RETICULOCYTES
Immature Retic Fract: 10 % (ref 2.3–15.9)
RBC.: 4.46 MIL/uL (ref 3.87–5.11)
Retic Count, Absolute: 50 10*3/uL (ref 19.0–186.0)
Retic Ct Pct: 1.1 % (ref 0.4–3.1)

## 2021-10-27 LAB — FERRITIN: Ferritin: 29 ng/mL (ref 11–307)

## 2021-11-04 DIAGNOSIS — J309 Allergic rhinitis, unspecified: Secondary | ICD-10-CM | POA: Diagnosis not present

## 2021-11-04 DIAGNOSIS — M549 Dorsalgia, unspecified: Secondary | ICD-10-CM | POA: Diagnosis not present

## 2021-11-04 DIAGNOSIS — Z6826 Body mass index (BMI) 26.0-26.9, adult: Secondary | ICD-10-CM | POA: Diagnosis not present

## 2021-11-04 DIAGNOSIS — N926 Irregular menstruation, unspecified: Secondary | ICD-10-CM | POA: Diagnosis not present

## 2021-12-08 DIAGNOSIS — Z3009 Encounter for other general counseling and advice on contraception: Secondary | ICD-10-CM | POA: Diagnosis not present

## 2021-12-08 DIAGNOSIS — E538 Deficiency of other specified B group vitamins: Secondary | ICD-10-CM | POA: Diagnosis not present

## 2021-12-08 DIAGNOSIS — Z6826 Body mass index (BMI) 26.0-26.9, adult: Secondary | ICD-10-CM | POA: Diagnosis not present

## 2021-12-08 DIAGNOSIS — E559 Vitamin D deficiency, unspecified: Secondary | ICD-10-CM | POA: Diagnosis not present

## 2021-12-08 DIAGNOSIS — Z0000 Encounter for general adult medical examination without abnormal findings: Secondary | ICD-10-CM | POA: Diagnosis not present

## 2021-12-08 DIAGNOSIS — Z309 Encounter for contraceptive management, unspecified: Secondary | ICD-10-CM | POA: Diagnosis not present

## 2021-12-08 DIAGNOSIS — N924 Excessive bleeding in the premenopausal period: Secondary | ICD-10-CM | POA: Diagnosis not present

## 2021-12-18 ENCOUNTER — Other Ambulatory Visit: Payer: Self-pay | Admitting: Internal Medicine

## 2021-12-18 DIAGNOSIS — Z1231 Encounter for screening mammogram for malignant neoplasm of breast: Secondary | ICD-10-CM

## 2021-12-23 ENCOUNTER — Inpatient Hospital Stay: Payer: BC Managed Care – PPO | Admitting: Family

## 2021-12-23 ENCOUNTER — Other Ambulatory Visit: Payer: Self-pay

## 2021-12-23 ENCOUNTER — Encounter: Payer: Self-pay | Admitting: Family

## 2021-12-23 ENCOUNTER — Inpatient Hospital Stay: Payer: BC Managed Care – PPO | Attending: Family

## 2021-12-23 DIAGNOSIS — D508 Other iron deficiency anemias: Secondary | ICD-10-CM

## 2021-12-23 DIAGNOSIS — D500 Iron deficiency anemia secondary to blood loss (chronic): Secondary | ICD-10-CM | POA: Insufficient documentation

## 2021-12-23 DIAGNOSIS — D649 Anemia, unspecified: Secondary | ICD-10-CM

## 2021-12-23 DIAGNOSIS — K909 Intestinal malabsorption, unspecified: Secondary | ICD-10-CM

## 2021-12-23 DIAGNOSIS — E538 Deficiency of other specified B group vitamins: Secondary | ICD-10-CM | POA: Insufficient documentation

## 2021-12-23 DIAGNOSIS — Z1231 Encounter for screening mammogram for malignant neoplasm of breast: Secondary | ICD-10-CM

## 2021-12-23 DIAGNOSIS — N920 Excessive and frequent menstruation with regular cycle: Secondary | ICD-10-CM | POA: Insufficient documentation

## 2021-12-23 DIAGNOSIS — K900 Celiac disease: Secondary | ICD-10-CM | POA: Insufficient documentation

## 2021-12-23 LAB — CBC WITH DIFFERENTIAL (CANCER CENTER ONLY)
Abs Immature Granulocytes: 0.05 10*3/uL (ref 0.00–0.07)
Basophils Absolute: 0.1 10*3/uL (ref 0.0–0.1)
Basophils Relative: 1 %
Eosinophils Absolute: 0.3 10*3/uL (ref 0.0–0.5)
Eosinophils Relative: 5 %
HCT: 37.1 % (ref 36.0–46.0)
Hemoglobin: 11.7 g/dL — ABNORMAL LOW (ref 12.0–15.0)
Immature Granulocytes: 1 %
Lymphocytes Relative: 23 %
Lymphs Abs: 1.2 10*3/uL (ref 0.7–4.0)
MCH: 25.9 pg — ABNORMAL LOW (ref 26.0–34.0)
MCHC: 31.5 g/dL (ref 30.0–36.0)
MCV: 82.3 fL (ref 80.0–100.0)
Monocytes Absolute: 0.6 10*3/uL (ref 0.1–1.0)
Monocytes Relative: 10 %
Neutro Abs: 3.2 10*3/uL (ref 1.7–7.7)
Neutrophils Relative %: 60 %
Platelet Count: 179 10*3/uL (ref 150–400)
RBC: 4.51 MIL/uL (ref 3.87–5.11)
RDW: 14.7 % (ref 11.5–15.5)
WBC Count: 5.3 10*3/uL (ref 4.0–10.5)
nRBC: 0 % (ref 0.0–0.2)

## 2021-12-23 LAB — RETICULOCYTES
Immature Retic Fract: 18.2 % — ABNORMAL HIGH (ref 2.3–15.9)
RBC.: 4.49 MIL/uL (ref 3.87–5.11)
Retic Count, Absolute: 57.9 10*3/uL (ref 19.0–186.0)
Retic Ct Pct: 1.3 % (ref 0.4–3.1)

## 2021-12-23 LAB — FERRITIN: Ferritin: 4 ng/mL — ABNORMAL LOW (ref 11–307)

## 2021-12-23 MED ORDER — FOLIC ACID 1 MG PO TABS: 1.0000 mg | ORAL_TABLET | Freq: Every day | ORAL | 6 refills | Status: DC

## 2021-12-23 NOTE — Progress Notes (Signed)
Hematology and Oncology Follow Up Visit  Regina Mayo [id number] 01-Jul-1981 41 y.o. 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 She is doing well but notes some mild fatigue and SOB with exertion at times.  She got her first monthly Depo injection earlier this month and has not had ha cycle.  No bruising or petechiae. No blood loss noted.  No fever, chills, n/v, cough, rash, dizziness, chest pain, palpitations, abdominal pain or changes in bowel or bladder habits.  No swelling, tenderness, numbness or tingling in her extremities at this time.  No falls or syncope.  Appetite and hydration are good. Weight stable at 168 lbs.   ECOG Performance Status: 1 - Symptomatic but completely ambulatory  Medications:  Allergies as of 12/23/2021   No Known Allergies      Medication List        Accurate as of Dec 23, 2021  1:40 PM. If you have any questions, ask your nurse or doctor.          EPINEPHrine 0.3 mg/0.3 mL Soaj injection Commonly known as: EPI-PEN Inject 0.3 mLs (0.3 mg total) into the muscle as needed for anaphylaxis.   fluticasone 50 MCG/ACT nasal spray Commonly known as: FLONASE Place 2 sprays into both nostrils daily. As needed   folic acid 1 MG tablet Commonly known as: FOLVITE Take 1 tablet (1 mg total) by mouth daily.   levothyroxine 50 MCG tablet Commonly known as: SYNTHROID   loratadine-pseudoephedrine 5-120 MG tablet Commonly known as: CLARITIN-D 12-hour Take 1 tablet by mouth 2 (two) times daily.   traZODone 50 MG tablet Commonly known as: DESYREL Take 50 mg by mouth at bedtime as needed.   Trintellix 20 MG Tabs tablet Generic drug:  vortioxetine HBr Take 20 mg by mouth daily.   vitamin B-12 100 MCG tablet Commonly known as: CYANOCOBALAMIN Take 100 mcg by mouth daily.   cyanocobalamin 1000 MCG/ML injection Commonly known as: (VITAMIN B-12) Inject into the muscle once a week. Once a week x 4 weeks then once a month   Vitamin D (Ergocalciferol) 1.25 MG (50000 UNIT) Caps capsule Commonly known as: DRISDOL Take 50,000 Units by mouth every 7 (seven) days.   VITAMIN D3 PO        Allergies: No Known Allergies  Past Medical History, Surgical history, Social history, and Family History were reviewed and updated.  Review of Systems: All other 10 point review of systems is negative.   Physical Exam:  vitals were not taken for this visit.   Wt Readings from Last 3 Encounters:  08/31/21 168 lb (76.2 kg)  06/29/21 163 lb 1.9 oz (74 kg)  04/27/21 163 lb 12.8 oz (74.3 kg)    Ocular: Sclerae unicteric, pupils equal, round and reactive to light Ear-nose-throat: Oropharynx clear, dentition fair Lymphatic: No cervical or supraclavicular adenopathy Lungs no rales or rhonchi, good excursion bilaterally Heart regular rate and rhythm, no murmur appreciated Abd soft, nontender, positive bowel sounds MSK no focal spinal tenderness, no joint edema Neuro: non-focal, well-oriented, appropriate affect Breasts: Deferred   Lab Results  Component Value Date   WBC 5.3 12/23/2021   HGB 11.7 (  L) 12/23/2021   HCT 37.1 12/23/2021   MCV 82.3 12/23/2021   PLT 179 12/23/2021   Lab Results  Component Value Date   FERRITIN 29 10/26/2021   IRON 97 10/26/2021   TIBC 409 10/26/2021   UIBC 312 10/26/2021   IRONPCTSAT 24 10/26/2021   Lab Results  Component Value Date   RETICCTPCT 1.3 12/23/2021   RBC 4.49 12/23/2021   RBC 4.51 12/23/2021   RETICCTABS 58.1 08/08/2015   No results found for: KPAFRELGTCHN, LAMBDASER, KAPLAMBRATIO No results found for: IGGSERUM, IGA, IGMSERUM No results found for: Regina Mayo, SPEI   Chemistry      Component Value Date/Time   NA 140 11/03/2020 0911   NA 138 08/08/2015 1214   K 3.9 11/03/2020 0911   K 4.1 08/08/2015 1214   CL 107 11/03/2020 0911   CL 101 01/22/2015 1412   CO2 26 11/03/2020 0911   CO2 26 08/08/2015 1214   BUN 12 11/03/2020 0911   BUN 17.2 08/08/2015 1214   CREATININE 0.83 11/03/2020 0911   CREATININE 0.8 08/08/2015 1214      Component Value Date/Time   CALCIUM 9.0 11/03/2020 0911   CALCIUM 9.1 08/08/2015 1214   ALKPHOS 63 11/03/2020 0911   ALKPHOS 78 08/08/2015 1214   AST 12 (L) 11/03/2020 0911   AST 21 08/08/2015 1214   ALT 12 11/03/2020 0911   ALT 15 08/08/2015 1214   BILITOT 0.4 11/03/2020 0911   BILITOT <0.30 08/08/2015 1214       Impression and Plan: Regina Mayo is a very pleasant 41 yo [name] with iron deficiency anemia secondary to menorrhagia and malabsorption with celiac disease.  Iron studies pending.  Lab check every 3 months, follow-up in 6 months.   Regina Dawson, NP 5/24/20231:40 PM

## 2021-12-24 LAB — IRON AND IRON BINDING CAPACITY (CC-WL,HP ONLY)
Iron: 47 ug/dL (ref 28–170)
Saturation Ratios: 9 % — ABNORMAL LOW (ref 10.4–31.8)
TIBC: 503 ug/dL — ABNORMAL HIGH (ref 250–450)
UIBC: 456 ug/dL — ABNORMAL HIGH (ref 148–442)

## 2021-12-25 ENCOUNTER — Ambulatory Visit: Payer: BC Managed Care – PPO | Admitting: Family

## 2021-12-25 ENCOUNTER — Other Ambulatory Visit: Payer: BC Managed Care – PPO

## 2021-12-31 ENCOUNTER — Telehealth: Payer: Self-pay | Admitting: Family

## 2021-12-31 NOTE — Telephone Encounter (Signed)
Called to schedule 3 doses of iron per 5/26 sch msg , left voicemail for patient to call us back to schedule

## 2022-01-18 ENCOUNTER — Inpatient Hospital Stay: Payer: BC Managed Care – PPO | Attending: Family

## 2022-01-18 VITALS — BP 124/76 | HR 63 | Temp 98.3°F | Resp 17

## 2022-01-18 DIAGNOSIS — N920 Excessive and frequent menstruation with regular cycle: Secondary | ICD-10-CM | POA: Diagnosis not present

## 2022-01-18 DIAGNOSIS — K909 Intestinal malabsorption, unspecified: Secondary | ICD-10-CM | POA: Diagnosis not present

## 2022-01-18 DIAGNOSIS — N921 Excessive and frequent menstruation with irregular cycle: Secondary | ICD-10-CM

## 2022-01-18 DIAGNOSIS — D500 Iron deficiency anemia secondary to blood loss (chronic): Secondary | ICD-10-CM | POA: Insufficient documentation

## 2022-01-18 DIAGNOSIS — K900 Celiac disease: Secondary | ICD-10-CM | POA: Insufficient documentation

## 2022-01-18 DIAGNOSIS — D508 Other iron deficiency anemias: Secondary | ICD-10-CM

## 2022-01-18 MED ORDER — SODIUM CHLORIDE 0.9 % IV SOLN: Freq: Once | INTRAVENOUS | Status: AC

## 2022-01-18 MED ORDER — SODIUM CHLORIDE 0.9 % IV SOLN
300.0000 mg | Freq: Once | INTRAVENOUS | Status: AC
  Administered 2022-01-18: 300 mg via INTRAVENOUS
  Filled 2022-01-18: qty 300

## 2022-01-18 NOTE — Progress Notes (Signed)
Pt declined to stay for post infusion observation period. Pt stated she has tolerated medication multiple times prior without difficulty. Pt aware to call clinic with any questions or concerns. Pt verbalized understanding and had no further questions.  ? ?

## 2022-01-18 NOTE — Patient Instructions (Signed)

## 2022-01-25 ENCOUNTER — Inpatient Hospital Stay: Payer: BC Managed Care – PPO

## 2022-01-25 VITALS — BP 126/75 | HR 74 | Temp 97.9°F | Resp 17

## 2022-01-25 DIAGNOSIS — N921 Excessive and frequent menstruation with irregular cycle: Secondary | ICD-10-CM

## 2022-01-25 DIAGNOSIS — D508 Other iron deficiency anemias: Secondary | ICD-10-CM

## 2022-01-25 DIAGNOSIS — K900 Celiac disease: Secondary | ICD-10-CM

## 2022-01-25 DIAGNOSIS — N920 Excessive and frequent menstruation with regular cycle: Secondary | ICD-10-CM | POA: Diagnosis not present

## 2022-01-25 DIAGNOSIS — K909 Intestinal malabsorption, unspecified: Secondary | ICD-10-CM

## 2022-01-25 DIAGNOSIS — D500 Iron deficiency anemia secondary to blood loss (chronic): Secondary | ICD-10-CM | POA: Diagnosis not present

## 2022-01-25 MED ORDER — SODIUM CHLORIDE 0.9 % IV SOLN
300.0000 mg | Freq: Once | INTRAVENOUS | Status: AC
  Administered 2022-01-25: 300 mg via INTRAVENOUS
  Filled 2022-01-25: qty 300

## 2022-01-25 MED ORDER — SODIUM CHLORIDE 0.9 % IV SOLN: Freq: Once | INTRAVENOUS | Status: AC

## 2022-02-01 ENCOUNTER — Inpatient Hospital Stay: Payer: BC Managed Care – PPO | Attending: Family

## 2022-02-01 VITALS — BP 114/81 | HR 62 | Temp 98.8°F | Resp 18

## 2022-02-01 DIAGNOSIS — N920 Excessive and frequent menstruation with regular cycle: Secondary | ICD-10-CM | POA: Insufficient documentation

## 2022-02-01 DIAGNOSIS — K909 Intestinal malabsorption, unspecified: Secondary | ICD-10-CM | POA: Insufficient documentation

## 2022-02-01 DIAGNOSIS — D500 Iron deficiency anemia secondary to blood loss (chronic): Secondary | ICD-10-CM | POA: Diagnosis not present

## 2022-02-01 DIAGNOSIS — K900 Celiac disease: Secondary | ICD-10-CM

## 2022-02-01 DIAGNOSIS — N921 Excessive and frequent menstruation with irregular cycle: Secondary | ICD-10-CM

## 2022-02-01 DIAGNOSIS — D508 Other iron deficiency anemias: Secondary | ICD-10-CM

## 2022-02-01 MED ORDER — SODIUM CHLORIDE 0.9 % IV SOLN
300.0000 mg | Freq: Once | INTRAVENOUS | Status: AC
  Administered 2022-02-01: 300 mg via INTRAVENOUS
  Filled 2022-02-01: qty 300

## 2022-02-01 MED ORDER — SODIUM CHLORIDE 0.9 % IV SOLN: Freq: Once | INTRAVENOUS | Status: AC

## 2022-02-01 NOTE — Progress Notes (Signed)
Patient does not want to stay for the recommended post IV iron observation. Patient discharged ambulatory without any questions or concerns.

## 2022-02-03 ENCOUNTER — Inpatient Hospital Stay: Payer: BC Managed Care – PPO

## 2022-03-02 DIAGNOSIS — Z79899 Other long term (current) drug therapy: Secondary | ICD-10-CM | POA: Diagnosis not present

## 2022-03-02 DIAGNOSIS — Z3009 Encounter for other general counseling and advice on contraception: Secondary | ICD-10-CM | POA: Diagnosis not present

## 2022-03-02 DIAGNOSIS — Z309 Encounter for contraceptive management, unspecified: Secondary | ICD-10-CM | POA: Diagnosis not present

## 2022-03-23 ENCOUNTER — Inpatient Hospital Stay: Payer: BC Managed Care – PPO | Attending: Family

## 2022-03-23 DIAGNOSIS — D500 Iron deficiency anemia secondary to blood loss (chronic): Secondary | ICD-10-CM

## 2022-03-23 DIAGNOSIS — D649 Anemia, unspecified: Secondary | ICD-10-CM

## 2022-03-23 DIAGNOSIS — N920 Excessive and frequent menstruation with regular cycle: Secondary | ICD-10-CM | POA: Insufficient documentation

## 2022-03-23 LAB — CBC WITH DIFFERENTIAL (CANCER CENTER ONLY)
Abs Immature Granulocytes: 0.06 10*3/uL (ref 0.00–0.07)
Basophils Absolute: 0.1 10*3/uL (ref 0.0–0.1)
Basophils Relative: 1 %
Eosinophils Absolute: 0.2 10*3/uL (ref 0.0–0.5)
Eosinophils Relative: 3 %
HCT: 42.7 % (ref 36.0–46.0)
Hemoglobin: 13.6 g/dL (ref 12.0–15.0)
Immature Granulocytes: 1 %
Lymphocytes Relative: 26 %
Lymphs Abs: 1.3 10*3/uL (ref 0.7–4.0)
MCH: 26.8 pg (ref 26.0–34.0)
MCHC: 31.9 g/dL (ref 30.0–36.0)
MCV: 84.1 fL (ref 80.0–100.0)
Monocytes Absolute: 0.4 10*3/uL (ref 0.1–1.0)
Monocytes Relative: 8 %
Neutro Abs: 3 10*3/uL (ref 1.7–7.7)
Neutrophils Relative %: 61 %
Platelet Count: 165 10*3/uL (ref 150–400)
RBC: 5.08 MIL/uL (ref 3.87–5.11)
RDW: 15.7 % — ABNORMAL HIGH (ref 11.5–15.5)
WBC Count: 5 10*3/uL (ref 4.0–10.5)
nRBC: 0 % (ref 0.0–0.2)

## 2022-03-23 LAB — RETICULOCYTES
Immature Retic Fract: 10 % (ref 2.3–15.9)
RBC.: 4.99 MIL/uL (ref 3.87–5.11)
Retic Count, Absolute: 62.9 10*3/uL (ref 19.0–186.0)
Retic Ct Pct: 1.3 % (ref 0.4–3.1)

## 2022-03-23 LAB — FERRITIN: Ferritin: 84 ng/mL (ref 11–307)

## 2022-03-24 ENCOUNTER — Inpatient Hospital Stay: Payer: BC Managed Care – PPO

## 2022-03-24 LAB — IRON AND IRON BINDING CAPACITY (CC-WL,HP ONLY)
Iron: 126 ug/dL (ref 28–170)
Saturation Ratios: 35 % — ABNORMAL HIGH (ref 10.4–31.8)
TIBC: 364 ug/dL (ref 250–450)
UIBC: 238 ug/dL (ref 148–442)

## 2022-04-02 IMAGING — US US PELVIS COMPLETE WITH TRANSVAGINAL
1 series · 14 of 25 positions shown · non-contrast
Comparison: None

CLINICAL DATA: Irregular menstrual cycles

EXAM:
TRANSABDOMINAL AND TRANSVAGINAL ULTRASOUND OF PELVIS
TECHNIQUE: Both transabdominal and transvaginal ultrasound examinations of the
pelvis were performed. Transabdominal technique was performed for
global imaging of the pelvis including uterus, ovaries, adnexal
regions, and pelvic cul-de-sac. It was necessary to proceed with
endovaginal exam following the transabdominal exam to visualize the
ovaries and adnexa.

[Series 1: us pelvis complete with transvaginal · 14 of 72 slices shown]
[im 1/72]
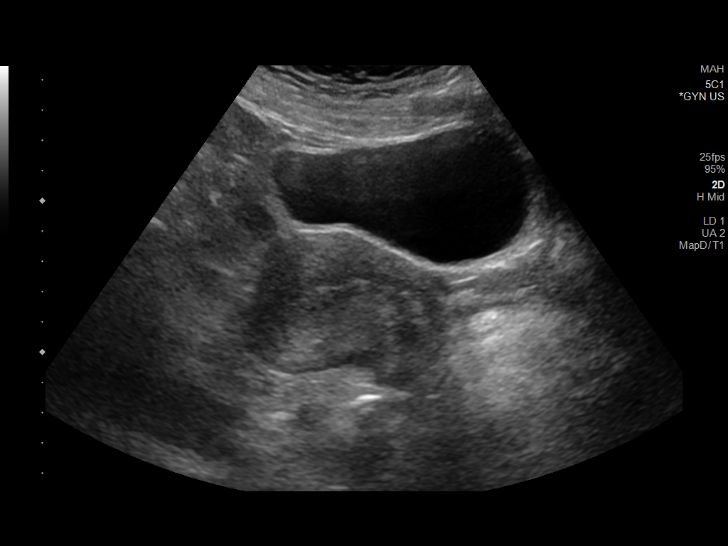
[im 6/72]
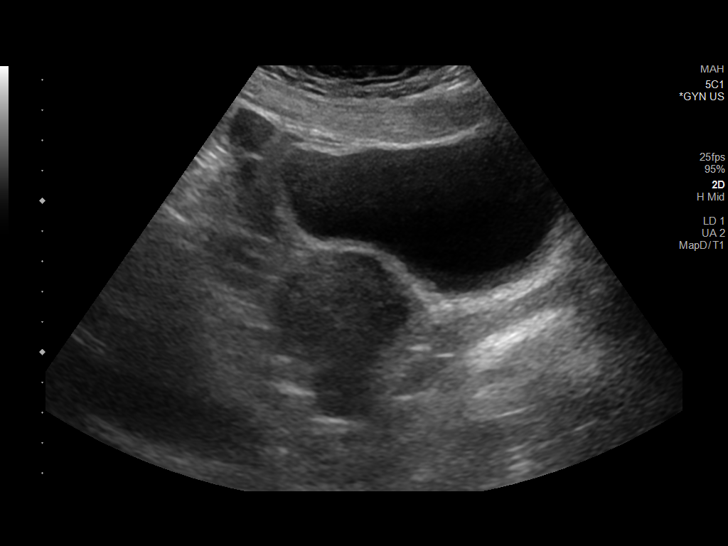
[im 12/72]
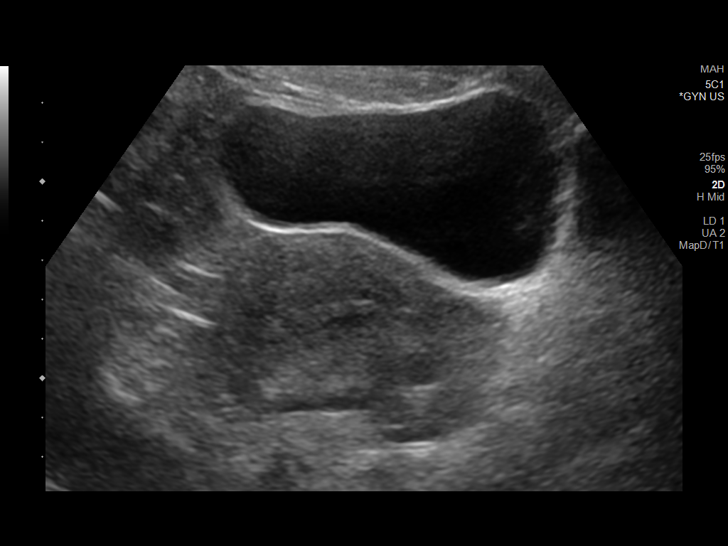
[im 18/72]
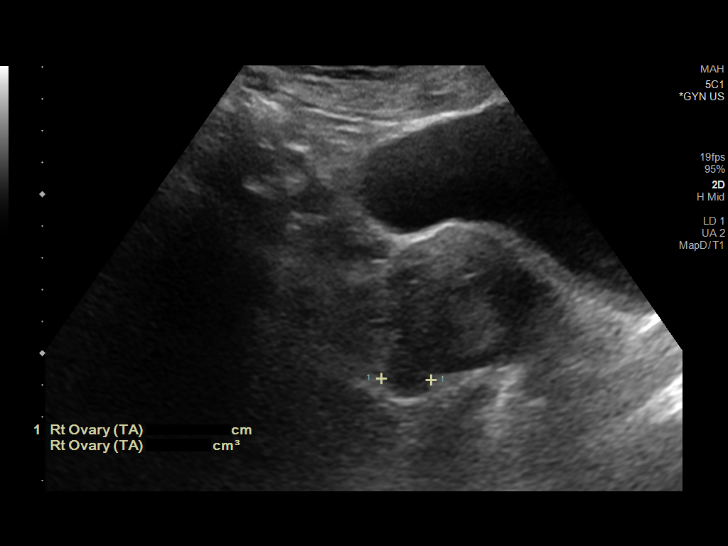
[im 24/72]
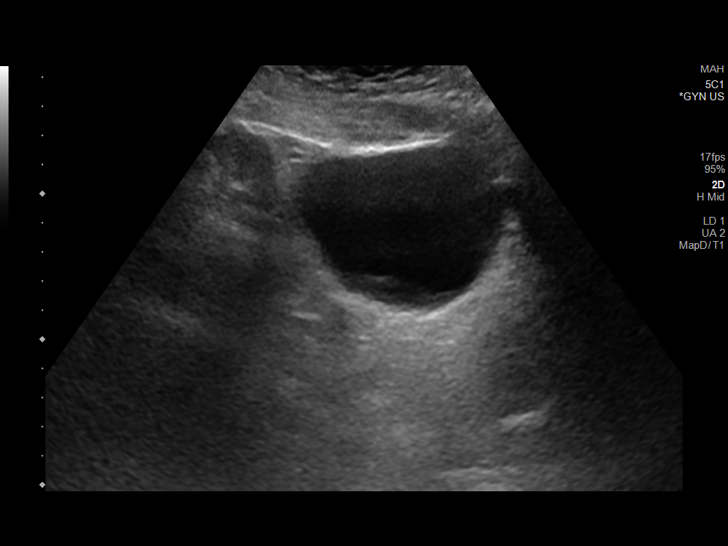
[im 27/72]
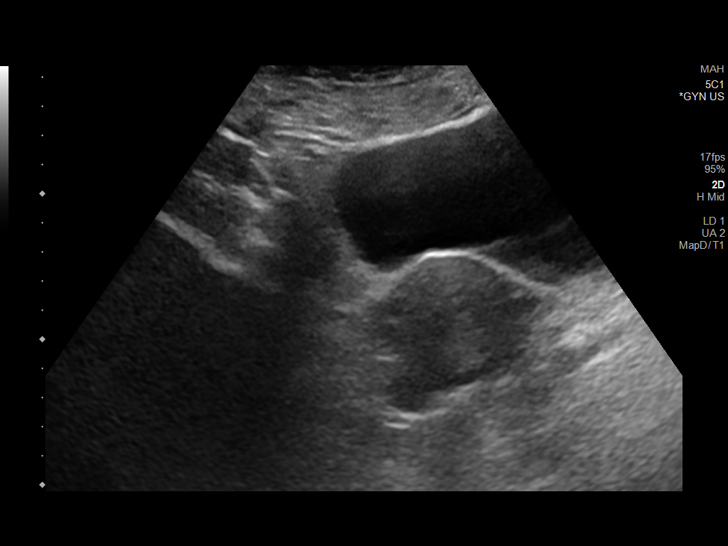
[im 33/72]
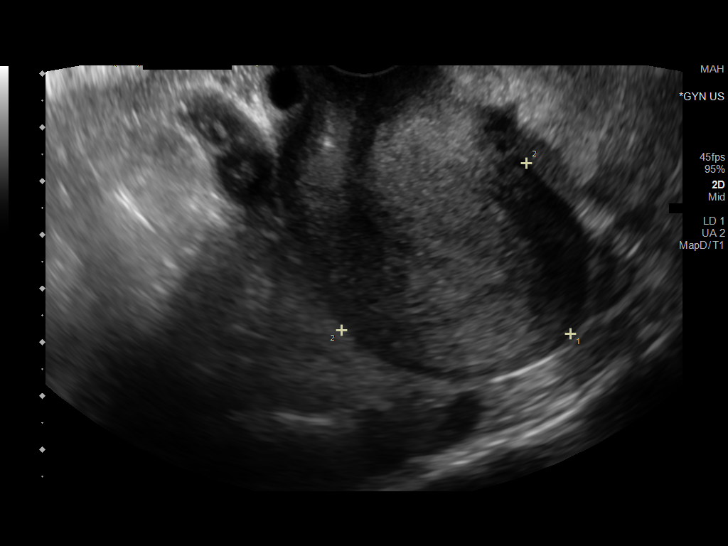
[im 39/72]
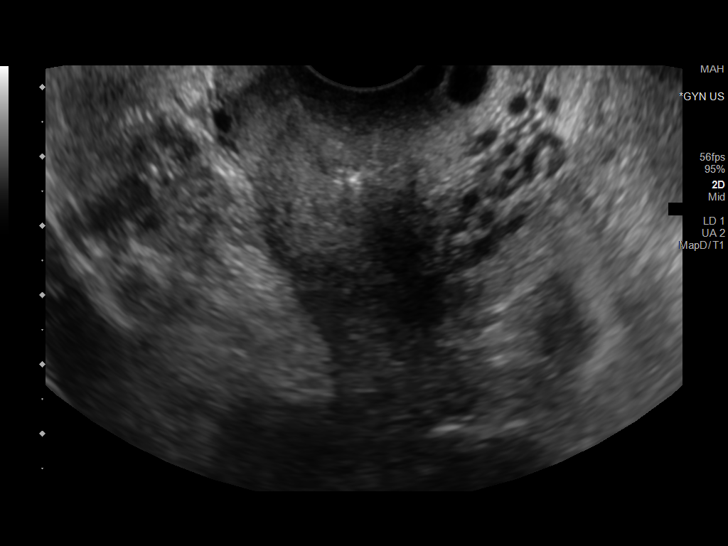
[im 45/72]
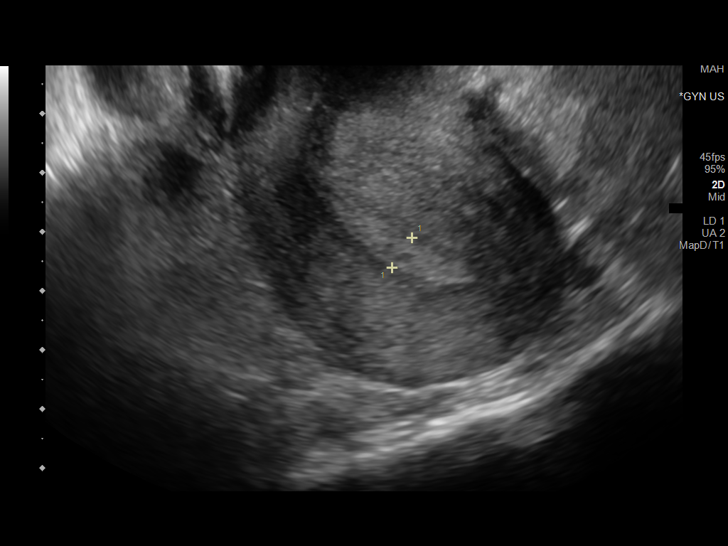
[im 48/72]
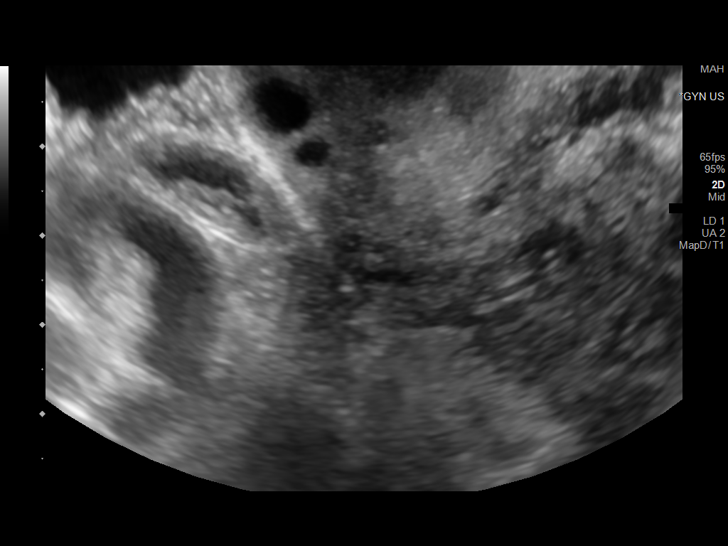
[im 54/72]
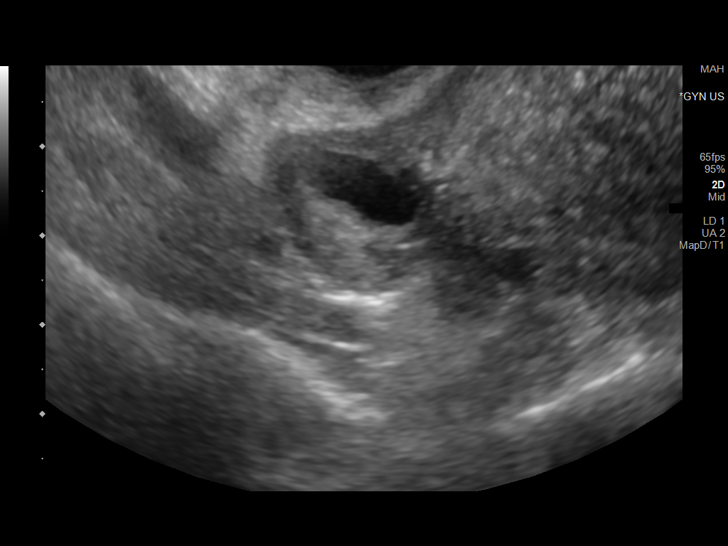
[im 60/72]
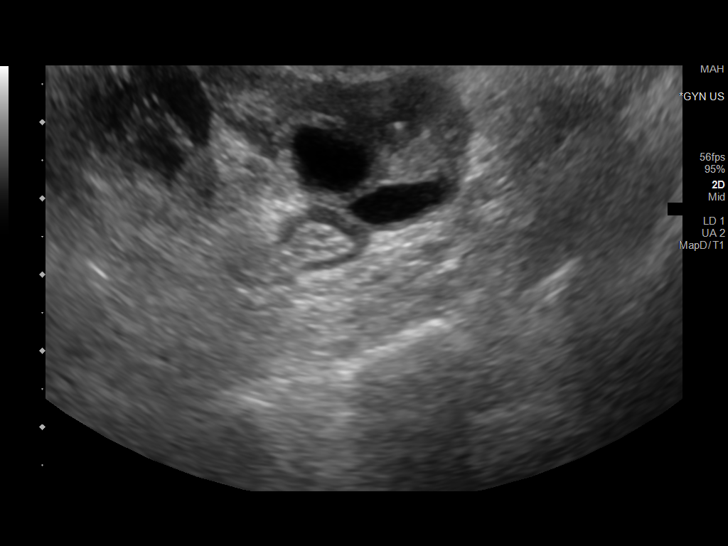
[im 66/72]
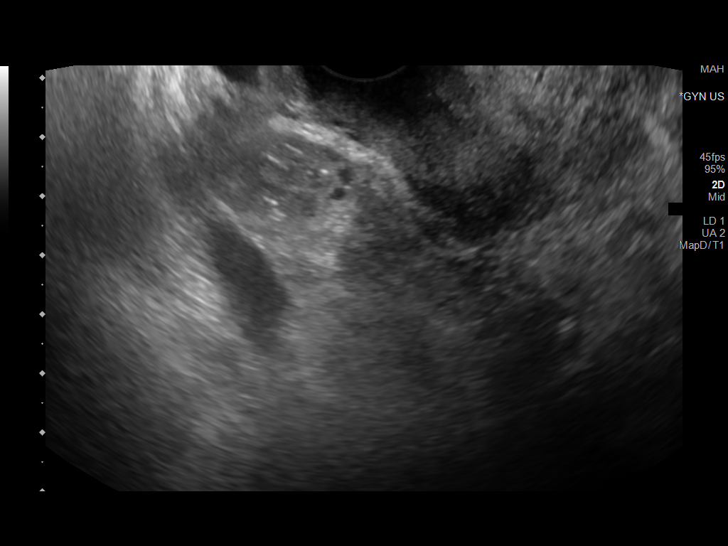
[im 72/72]
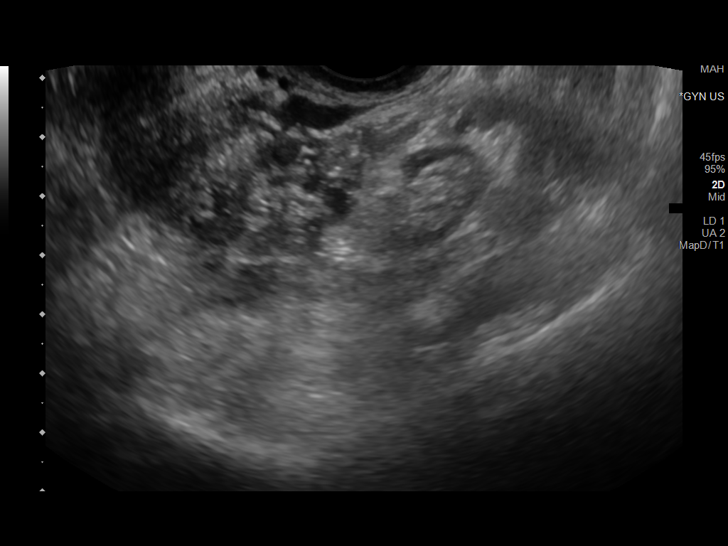

[14 of 25 positions shown; findings below may reference images not displayed]

FINDINGS: Uterus

Measurements: 7.7 x 4.6 x 5.2 cm = volume: 97 mL. No fibroids or
other mass visualized.

Endometrium

Thickness: 6 mm.  No focal abnormality visualized.

Right ovary

Measurements: 2.7 x 1.9 x 2.0 cm = volume: 5.2 mL. Normal
appearance/no adnexal mass.

Left ovary

Measurements: 3.9 x 2.0 x 2.6 cm = volume: 10.8 mL. Normal
appearance/no adnexal mass.

Other findings

No abnormal free fluid.
IMPRESSION: No significant sonographic abnormality of the uterus or ovaries.

## 2022-04-02 IMAGING — DX DG KNEE COMPLETE 4+V*R*
4 series · 4 of 4 positions shown · non-contrast
Comparison: None.

CLINICAL DATA: Right knee pain and bruising and lateral distal
femur

EXAM:
RIGHT KNEE - COMPLETE 4+ VIEW

[knee ap]
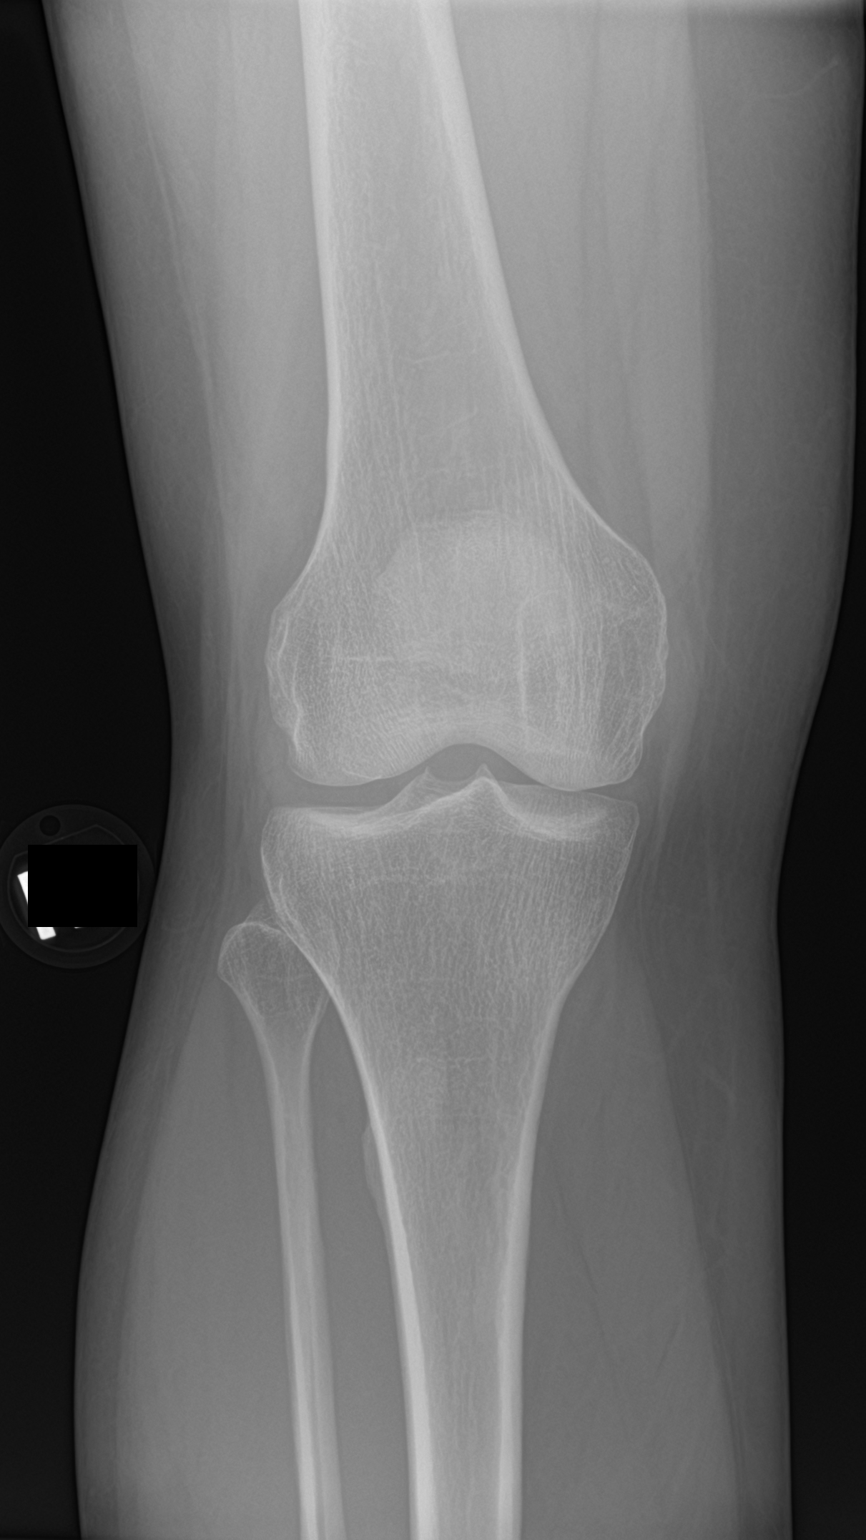

[knee obl (1 of 2)]
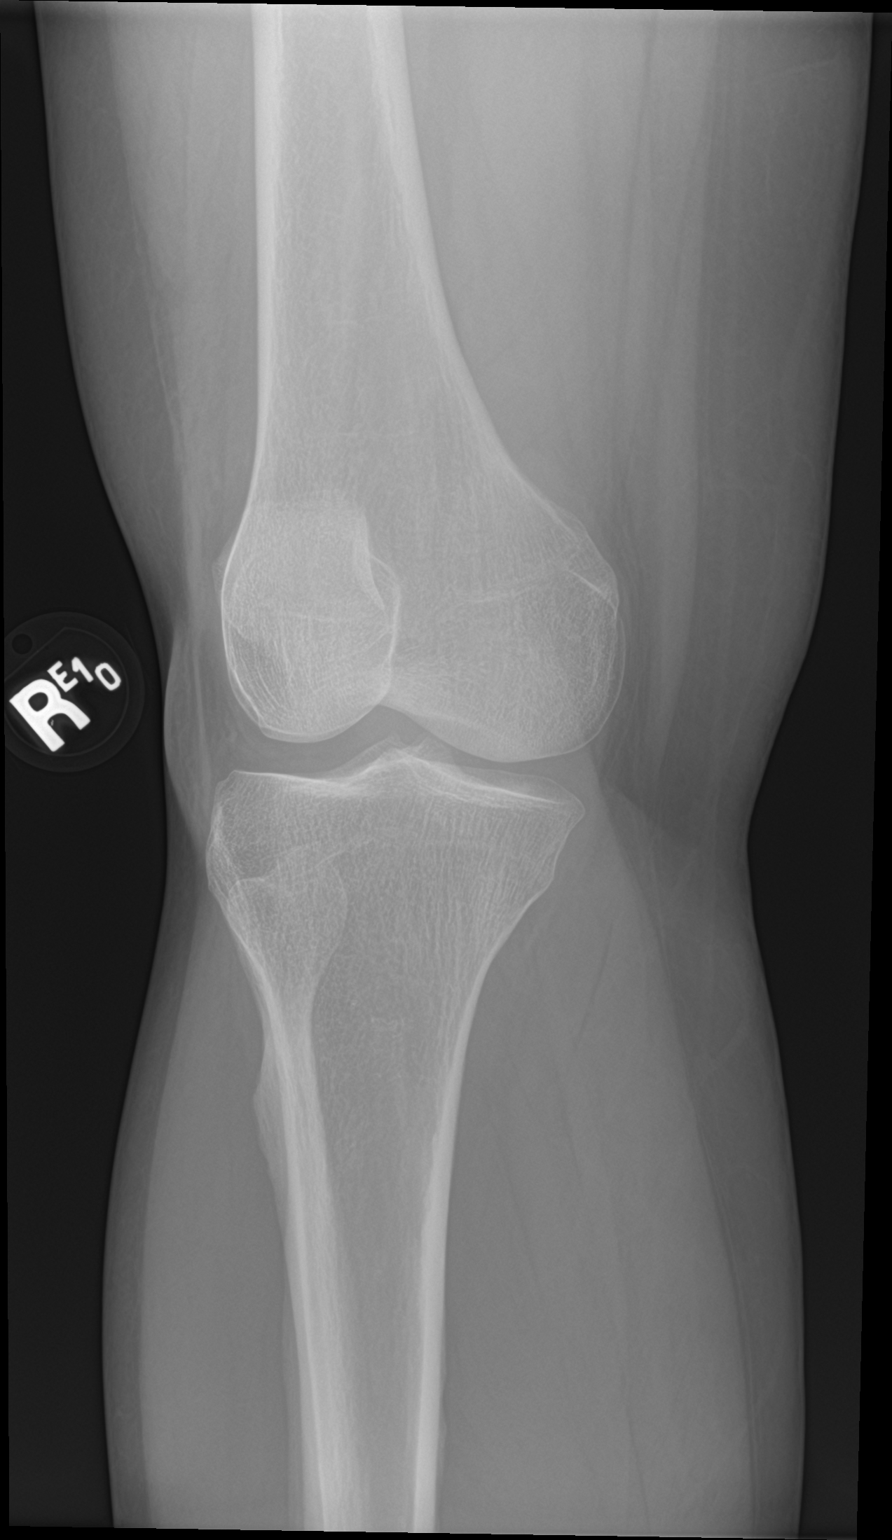

[knee obl (2 of 2)]
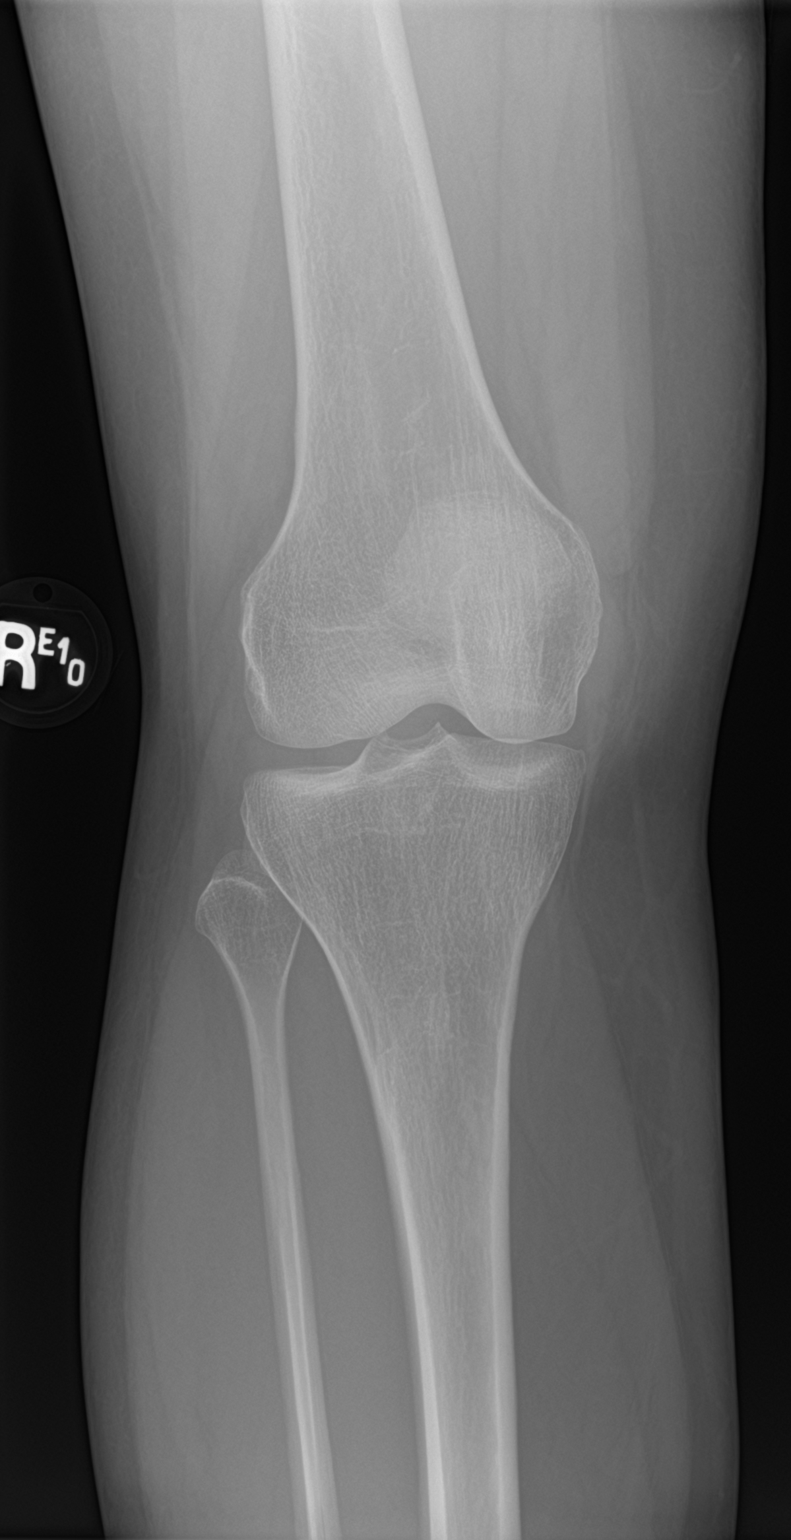

[knee lat]
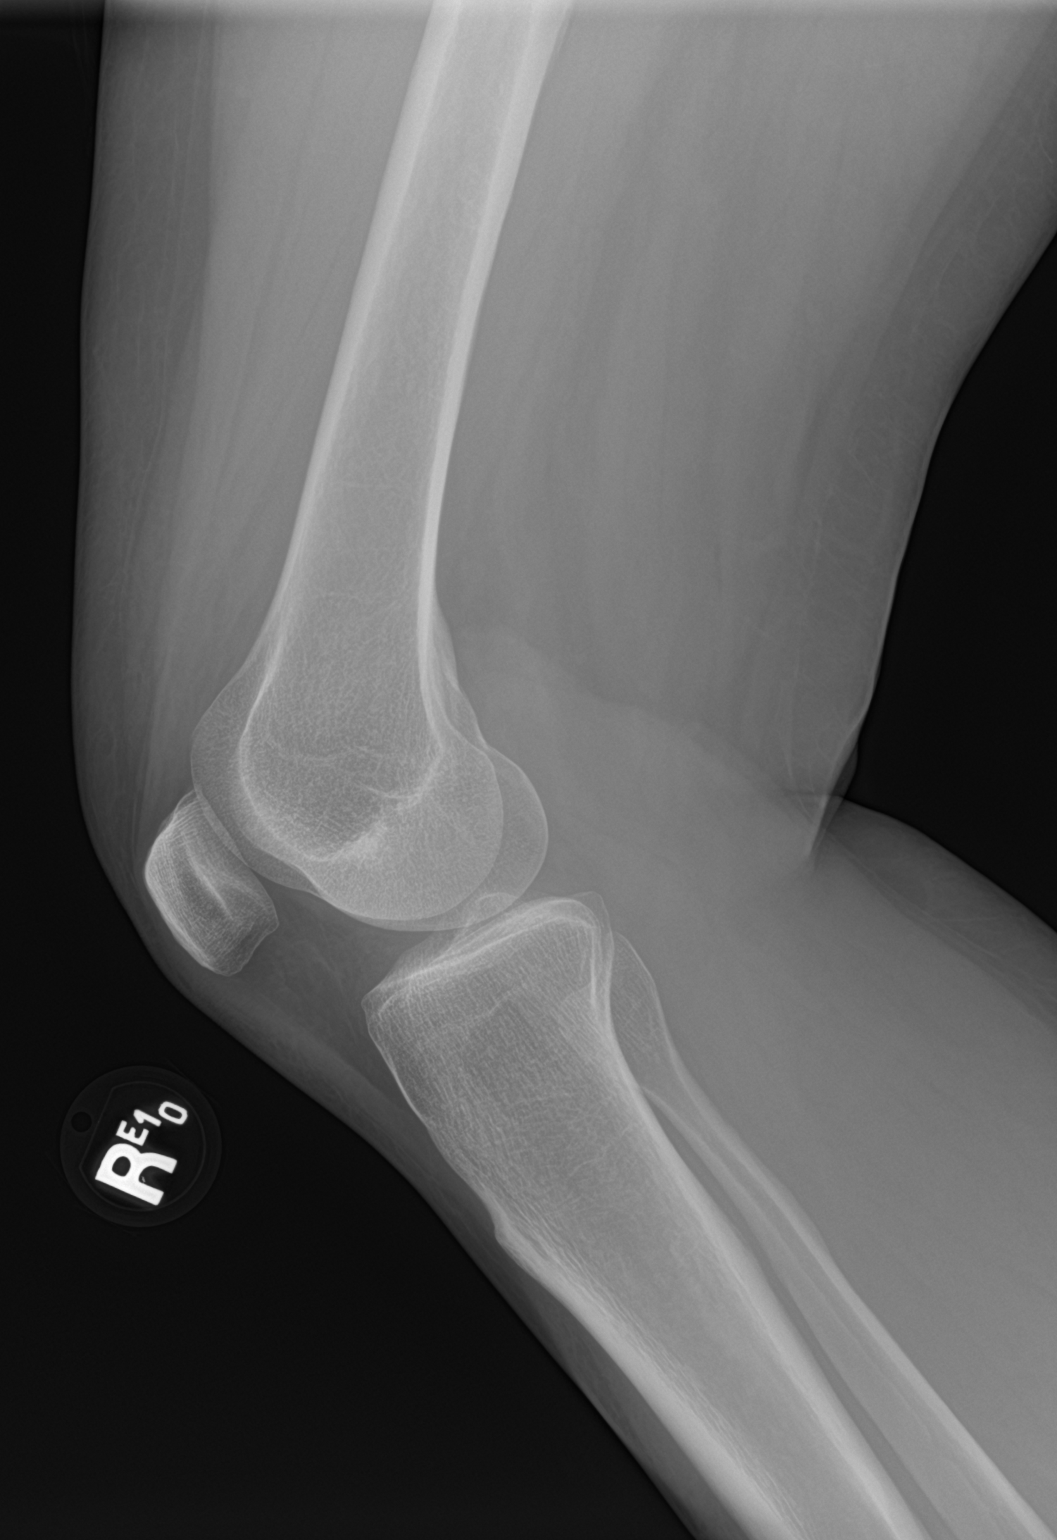

[4 of 4 positions shown; findings below may reference images not displayed]

FINDINGS: There is no acute fracture or dislocation. Alignment is normal. The
joint spaces are preserved. The soft tissues are unremarkable. There
is no joint effusion.
IMPRESSION: Unremarkable knee radiographs.

## 2022-06-01 DIAGNOSIS — Z3202 Encounter for pregnancy test, result negative: Secondary | ICD-10-CM | POA: Diagnosis not present

## 2022-06-01 DIAGNOSIS — Z309 Encounter for contraceptive management, unspecified: Secondary | ICD-10-CM | POA: Diagnosis not present

## 2022-06-01 DIAGNOSIS — Z3009 Encounter for other general counseling and advice on contraception: Secondary | ICD-10-CM | POA: Diagnosis not present

## 2022-06-09 DIAGNOSIS — D649 Anemia, unspecified: Secondary | ICD-10-CM | POA: Diagnosis not present

## 2022-06-09 DIAGNOSIS — E039 Hypothyroidism, unspecified: Secondary | ICD-10-CM | POA: Diagnosis not present

## 2022-06-16 DIAGNOSIS — K900 Celiac disease: Secondary | ICD-10-CM | POA: Diagnosis not present

## 2022-06-16 DIAGNOSIS — D649 Anemia, unspecified: Secondary | ICD-10-CM | POA: Diagnosis not present

## 2022-06-16 DIAGNOSIS — E039 Hypothyroidism, unspecified: Secondary | ICD-10-CM | POA: Diagnosis not present

## 2022-06-16 DIAGNOSIS — E559 Vitamin D deficiency, unspecified: Secondary | ICD-10-CM | POA: Diagnosis not present

## 2022-06-28 DIAGNOSIS — J069 Acute upper respiratory infection, unspecified: Secondary | ICD-10-CM | POA: Diagnosis not present

## 2022-07-07 ENCOUNTER — Inpatient Hospital Stay (HOSPITAL_BASED_OUTPATIENT_CLINIC_OR_DEPARTMENT_OTHER): Payer: BC Managed Care – PPO | Admitting: Family

## 2022-07-07 ENCOUNTER — Inpatient Hospital Stay: Payer: BC Managed Care – PPO | Attending: Hematology & Oncology

## 2022-07-07 ENCOUNTER — Encounter: Payer: Self-pay | Admitting: Family

## 2022-07-07 VITALS — BP 136/90 | HR 76 | Temp 98.2°F | Resp 17 | Ht 67.0 in | Wt 177.1 lb

## 2022-07-07 DIAGNOSIS — K900 Celiac disease: Secondary | ICD-10-CM | POA: Diagnosis not present

## 2022-07-07 DIAGNOSIS — E538 Deficiency of other specified B group vitamins: Secondary | ICD-10-CM | POA: Diagnosis not present

## 2022-07-07 DIAGNOSIS — N920 Excessive and frequent menstruation with regular cycle: Secondary | ICD-10-CM | POA: Insufficient documentation

## 2022-07-07 DIAGNOSIS — D500 Iron deficiency anemia secondary to blood loss (chronic): Secondary | ICD-10-CM | POA: Diagnosis not present

## 2022-07-07 DIAGNOSIS — D508 Other iron deficiency anemias: Secondary | ICD-10-CM | POA: Diagnosis not present

## 2022-07-07 DIAGNOSIS — K909 Intestinal malabsorption, unspecified: Secondary | ICD-10-CM | POA: Diagnosis not present

## 2022-07-07 DIAGNOSIS — D649 Anemia, unspecified: Secondary | ICD-10-CM

## 2022-07-07 LAB — CBC WITH DIFFERENTIAL (CANCER CENTER ONLY)
Abs Immature Granulocytes: 0.05 10*3/uL (ref 0.00–0.07)
Basophils Absolute: 0.1 10*3/uL (ref 0.0–0.1)
Basophils Relative: 1 %
Eosinophils Absolute: 0.2 10*3/uL (ref 0.0–0.5)
Eosinophils Relative: 3 %
HCT: 43.9 % (ref 36.0–46.0)
Hemoglobin: 14 g/dL (ref 12.0–15.0)
Immature Granulocytes: 1 %
Lymphocytes Relative: 24 %
Lymphs Abs: 1.6 10*3/uL (ref 0.7–4.0)
MCH: 27.6 pg (ref 26.0–34.0)
MCHC: 31.9 g/dL (ref 30.0–36.0)
MCV: 86.6 fL (ref 80.0–100.0)
Monocytes Absolute: 0.6 10*3/uL (ref 0.1–1.0)
Monocytes Relative: 9 %
Neutro Abs: 4 10*3/uL (ref 1.7–7.7)
Neutrophils Relative %: 62 %
Platelet Count: 203 10*3/uL (ref 150–400)
RBC: 5.07 MIL/uL (ref 3.87–5.11)
RDW: 13.2 % (ref 11.5–15.5)
WBC Count: 6.4 10*3/uL (ref 4.0–10.5)
nRBC: 0 % (ref 0.0–0.2)

## 2022-07-07 LAB — RETICULOCYTES
Immature Retic Fract: 12.9 % (ref 2.3–15.9)
RBC.: 5.05 MIL/uL (ref 3.87–5.11)
Retic Count, Absolute: 120.2 10*3/uL (ref 19.0–186.0)
Retic Ct Pct: 2.4 % (ref 0.4–3.1)

## 2022-07-07 LAB — FERRITIN: Ferritin: 49 ng/mL (ref 11–307)

## 2022-07-07 NOTE — Progress Notes (Signed)
Hematology and Oncology Follow Up Visit  Regina Mayo [id number] 1980/12/19 41 y.o. 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 She is doing fairly well but notes fatigue and SOB with over exertion (talking and walking).  She started doing a depo injection once every 3 months and received her third injection several weeks ago. Unfortunately, her cycle continues to be irregular, occurring frequently. She is on her cycle right now. Flow has seemed a little lighter.  No other blood loss noted. No bruising or petechiae.  No fever, chills, n/v, cough, rash, dizziness, chest pain, palpitations, abdominal pain or changes in bowel or bladder habits.  No swelling, numbness or tingling at this time.  No falls or syncope.  Appetite and hydration are good. Weight is 177 lbs.   ECOG Performance Status: 1 - Symptomatic but completely ambulatory  Medications:  Allergies as of 07/07/2022   No Known Allergies      Medication List        Accurate as of July 07, 2022  1:27 PM. If you have any questions, ask your nurse or doctor.          amoxicillin-clavulanate 875-125 MG tablet Commonly known as: AUGMENTIN Take 1 tablet by mouth 2 (two) times daily.   EPINEPHrine 0.3 mg/0.3 mL Soaj injection Commonly known as: EPI-PEN Inject 0.3 mLs (0.3 mg total) into the muscle as needed for anaphylaxis.   fluticasone 50 MCG/ACT nasal spray Commonly known as: FLONASE Place 2 sprays into both nostrils daily. As needed   folic acid 1 MG tablet Commonly known as: FOLVITE Take 1 tablet (1 mg total) by mouth daily.   levothyroxine 50 MCG tablet Commonly known as: SYNTHROID    loratadine-pseudoephedrine 5-120 MG tablet Commonly known as: CLARITIN-D 12-hour Take 1 tablet by mouth 2 (two) times daily.   predniSONE 10 MG (21) Tbpk tablet Commonly known as: STERAPRED UNI-PAK 21 TAB Take by mouth as directed.   traZODone 50 MG tablet Commonly known as: DESYREL Take 50 mg by mouth at bedtime as needed.   Trintellix 20 MG Tabs tablet Generic drug: vortioxetine HBr Take 20 mg by mouth daily.   Ventolin HFA 108 (90 Base) MCG/ACT inhaler Generic drug: albuterol Inhale 2 puffs into the lungs every 6 (six) hours as needed.   vitamin B-12 100 MCG tablet Commonly known as: CYANOCOBALAMIN Take 100 mcg by mouth daily.   cyanocobalamin 1000 MCG/ML injection Commonly known as: VITAMIN B12 Inject into the muscle once a week. Once a week x 4 weeks then once a month   Vitamin D (Ergocalciferol) 1.25 MG (50000 UNIT) Caps capsule Commonly known as: DRISDOL Take 50,000 Units by mouth every 7 (seven) days.   VITAMIN D3 PO        Allergies: No Known Allergies  Past Medical History, Surgical history, Social history, and Family History were reviewed and updated.  Review of Systems: All other 10 point review of systems is negative.   Physical Exam:  vitals were not taken for this visit.   Wt Readings from Last 3 Encounters:  12/23/21 168 lb 0.6 oz (76.2 kg)  08/31/21 168 lb (76.2 kg)  06/29/21  163 lb 1.9 oz (74 kg)    Ocular: Sclerae unicteric, pupils equal, round and reactive to light Ear-nose-throat: Oropharynx clear, dentition fair Lymphatic: No cervical or supraclavicular adenopathy Lungs no rales or rhonchi, good excursion bilaterally Heart regular rate and rhythm, no murmur appreciated Abd soft, nontender, positive bowel sounds MSK no focal spinal tenderness, no joint edema Neuro: non-focal, well-oriented, appropriate affect Breasts: Deferred   Lab Results  Component Value Date   WBC 5.0 03/23/2022   HGB 13.6 03/23/2022   HCT 42.7 03/23/2022    MCV 84.1 03/23/2022   PLT 165 03/23/2022   Lab Results  Component Value Date   FERRITIN 84 03/23/2022   IRON 126 03/23/2022   TIBC 364 03/23/2022   UIBC 238 03/23/2022   IRONPCTSAT 35 (H) 03/23/2022   Lab Results  Component Value Date   RETICCTPCT 1.3 03/23/2022   RBC 4.99 03/23/2022   RETICCTABS 58.1 08/08/2015   No results found for: "KPAFRELGTCHN", "LAMBDASER", "KAPLAMBRATIO" No results found for: "IGGSERUM", "IGA", "IGMSERUM" No results found for: "TOTALPROTELP", "ALBUMINELP", "A1GS", "A2GS", "BETS", "BETA2SER", "GAMS", "MSPIKE", "SPEI"   Chemistry      Component Value Date/Time   NA 140 11/03/2020 0911   NA 138 08/08/2015 1214   K 3.9 11/03/2020 0911   K 4.1 08/08/2015 1214   CL 107 11/03/2020 0911   CL 101 01/22/2015 1412   CO2 26 11/03/2020 0911   CO2 26 08/08/2015 1214   BUN 12 11/03/2020 0911   BUN 17.2 08/08/2015 1214   CREATININE 0.83 11/03/2020 0911   CREATININE 0.8 08/08/2015 1214      Component Value Date/Time   CALCIUM 9.0 11/03/2020 0911   CALCIUM 9.1 08/08/2015 1214   ALKPHOS 63 11/03/2020 0911   ALKPHOS 78 08/08/2015 1214   AST 12 (L) 11/03/2020 0911   AST 21 08/08/2015 1214   ALT 12 11/03/2020 0911   ALT 15 08/08/2015 1214   BILITOT 0.4 11/03/2020 0911   BILITOT <0.30 08/08/2015 1214       Impression and Plan: Regina Mayo is a very pleasant 41 yo [name] with iron deficiency anemia secondary to menorrhagia and malabsorption with celiac disease.  Iron studies pending.  Lab only in 3 months, follow-up in 6 months.   Lottie Dawson, NP 12/6/20231:27 PM

## 2022-07-08 LAB — IRON AND IRON BINDING CAPACITY (CC-WL,HP ONLY)
Iron: 92 ug/dL (ref 28–170)
Saturation Ratios: 25 % (ref 10.4–31.8)
TIBC: 363 ug/dL (ref 250–450)
UIBC: 271 ug/dL (ref 148–442)

## 2022-07-30 ENCOUNTER — Other Ambulatory Visit: Payer: Self-pay | Admitting: Obstetrics and Gynecology

## 2022-08-10 ENCOUNTER — Encounter (HOSPITAL_BASED_OUTPATIENT_CLINIC_OR_DEPARTMENT_OTHER): Payer: Self-pay | Admitting: Obstetrics and Gynecology

## 2022-08-10 NOTE — Progress Notes (Signed)
Spoke w/ via phone for pre-op interview--- [facility] needs dos---- CBC and UPT per surgeon              Lab results------ COVID test -----patient states asymptomatic no test needed Arrive at -------0630 NPO after MN NO Solid Food.   Med rec completed Medications to take morning of surgery -----Synthroid Diabetic medication ----- Patient instructed no nail polish to be worn day of surgery Patient instructed to bring photo id and insurance card day of surgery Patient aware to have Driver (ride ) / caregiver  Husband Elder Negus  for 24 hours after surgery  Patient Special Instructions ----- Pre-Op special Istructions ----- Patient verbalized understanding of instructions that were given at this phone interview. Patient denies shortness of breath, chest pain, fever, cough at this phone interview.

## 2022-08-12 NOTE — Anesthesia Preprocedure Evaluation (Addendum)
Anesthesia Evaluation  Patient identified by MRN, date of birth, ID band Patient awake    Reviewed: Allergy & Precautions, NPO status , Patient's Chart, lab work & pertinent test results  History of Anesthesia Complications Negative for: history of anesthetic complications  Airway Mallampati: II  TM Distance: >3 FB Neck ROM: Full    Dental  (+) Dental Advisory Given, Teeth Intact   Pulmonary neg pulmonary ROS   Pulmonary exam normal        Cardiovascular negative cardio ROS Normal cardiovascular exam     Neuro/Psych  PSYCHIATRIC DISORDERS  Depression    negative neurological ROS     GI/Hepatic Neg liver ROS,,, Celiac sprue    Endo/Other  Hypothyroidism    Renal/GU negative Renal ROS     Musculoskeletal negative musculoskeletal ROS (+)    Abdominal   Peds  Hematology negative hematology ROS (+)   Anesthesia Other Findings   Reproductive/Obstetrics                             Anesthesia Physical Anesthesia Plan  ASA: 2  Anesthesia Plan: General   Post-op Pain Management: Tylenol PO (pre-op)* and Celebrex PO (pre-op)*   Induction: Intravenous  PONV Risk Score and Plan: 3 and Treatment may vary due to age or medical condition, Ondansetron, Dexamethasone and Midazolam  Airway Management Planned: LMA  Additional Equipment: None  Intra-op Plan:   Post-operative Plan: Extubation in OR  Informed Consent: I have reviewed the patients History and Physical, chart, labs and discussed the procedure including the risks, benefits and alternatives for the proposed anesthesia with the patient or authorized representative who has indicated his/her understanding and acceptance.     Dental advisory given  Plan Discussed with: CRNA and Anesthesiologist  Anesthesia Plan Comments:        Anesthesia Quick Evaluation

## 2022-08-13 ENCOUNTER — Encounter (HOSPITAL_BASED_OUTPATIENT_CLINIC_OR_DEPARTMENT_OTHER)
Admission: RE | Disposition: A | Discharge status: Home / Self Care | Payer: Self-pay | Source: Ambulatory Visit | Attending: Obstetrics and Gynecology

## 2022-08-13 ENCOUNTER — Other Ambulatory Visit: Payer: Self-pay

## 2022-08-13 ENCOUNTER — Ambulatory Visit (HOSPITAL_BASED_OUTPATIENT_CLINIC_OR_DEPARTMENT_OTHER)
Admission: RE | Admit: 2022-08-13 | Discharge: 2022-08-13 | Disposition: A | Discharge status: Home / Self Care | Payer: BC Managed Care – PPO | Source: Ambulatory Visit | Attending: Obstetrics and Gynecology | Admitting: Obstetrics and Gynecology

## 2022-08-13 ENCOUNTER — Ambulatory Visit (HOSPITAL_BASED_OUTPATIENT_CLINIC_OR_DEPARTMENT_OTHER): Payer: BC Managed Care – PPO | Admitting: Anesthesiology

## 2022-08-13 ENCOUNTER — Encounter (HOSPITAL_BASED_OUTPATIENT_CLINIC_OR_DEPARTMENT_OTHER): Payer: Self-pay | Admitting: Obstetrics and Gynecology

## 2022-08-13 DIAGNOSIS — N841 Polyp of cervix uteri: Secondary | ICD-10-CM | POA: Diagnosis not present

## 2022-08-13 DIAGNOSIS — F32A Depression, unspecified: Secondary | ICD-10-CM | POA: Insufficient documentation

## 2022-08-13 DIAGNOSIS — E039 Hypothyroidism, unspecified: Secondary | ICD-10-CM | POA: Insufficient documentation

## 2022-08-13 DIAGNOSIS — N920 Excessive and frequent menstruation with regular cycle: Secondary | ICD-10-CM | POA: Diagnosis not present

## 2022-08-13 HISTORY — DX: Hypothyroidism, unspecified: E03.9

## 2022-08-13 HISTORY — PX: DILITATION & CURRETTAGE/HYSTROSCOPY WITH NOVASURE ABLATION: SHX5568

## 2022-08-13 HISTORY — PX: CERVICAL POLYPECTOMY: SHX88

## 2022-08-13 HISTORY — DX: Depression, unspecified: F32.A

## 2022-08-13 LAB — CBC
HCT: 45.1 % (ref 36.0–46.0)
Hemoglobin: 14.7 g/dL (ref 12.0–15.0)
MCH: 27.2 pg (ref 26.0–34.0)
MCHC: 32.6 g/dL (ref 30.0–36.0)
MCV: 83.4 fL (ref 80.0–100.0)
Platelets: 156 10*3/uL (ref 150–400)
RBC: 5.41 MIL/uL — ABNORMAL HIGH (ref 3.87–5.11)
RDW: 12.6 % (ref 11.5–15.5)
WBC: 4.3 10*3/uL (ref 4.0–10.5)
nRBC: 0 % (ref 0.0–0.2)

## 2022-08-13 LAB — POCT PREGNANCY, URINE: Preg Test, Ur: NEGATIVE

## 2022-08-13 SURGERY — DILATATION & CURETTAGE/HYSTEROSCOPY WITH NOVASURE ABLATION
Anesthesia: General | Site: Cervix

## 2022-08-13 MED ORDER — PHENYLEPHRINE HCL (PRESSORS) 10 MG/ML IV SOLN
INTRAVENOUS | Status: DC | PRN
  Administered 2022-08-13 (×2): 80 ug via INTRAVENOUS

## 2022-08-13 MED ORDER — ONDANSETRON HCL 4 MG/2ML IJ SOLN
INTRAMUSCULAR | Status: AC
  Filled 2022-08-13: qty 2

## 2022-08-13 MED ORDER — SODIUM CHLORIDE 0.9 % IR SOLN
Status: DC | PRN
  Administered 2022-08-13: 1

## 2022-08-13 MED ORDER — FENTANYL CITRATE (PF) 100 MCG/2ML IJ SOLN
25.0000 ug | INTRAMUSCULAR | Status: DC | PRN
  Administered 2022-08-13: 50 ug via INTRAVENOUS
  Administered 2022-08-13: 25 ug via INTRAVENOUS

## 2022-08-13 MED ORDER — CELECOXIB 200 MG PO CAPS
ORAL_CAPSULE | ORAL | Status: AC
  Filled 2022-08-13: qty 1

## 2022-08-13 MED ORDER — PROPOFOL 10 MG/ML IV BOLUS
INTRAVENOUS | Status: DC | PRN
  Administered 2022-08-13: 200 mg via INTRAVENOUS

## 2022-08-13 MED ORDER — DEXAMETHASONE SODIUM PHOSPHATE 10 MG/ML IJ SOLN
INTRAMUSCULAR | Status: AC
  Filled 2022-08-13: qty 1

## 2022-08-13 MED ORDER — AMISULPRIDE (ANTIEMETIC) 5 MG/2ML IV SOLN
10.0000 mg | Freq: Once | INTRAVENOUS | Status: AC
  Administered 2022-08-13: 10 mg via INTRAVENOUS

## 2022-08-13 MED ORDER — MIDAZOLAM HCL 2 MG/2ML IJ SOLN
INTRAMUSCULAR | Status: AC
  Filled 2022-08-13: qty 2

## 2022-08-13 MED ORDER — PROMETHAZINE HCL 25 MG/ML IJ SOLN: 6.2500 mg | INTRAMUSCULAR | Status: DC | PRN

## 2022-08-13 MED ORDER — MIDAZOLAM HCL 2 MG/2ML IJ SOLN
INTRAMUSCULAR | Status: DC | PRN
  Administered 2022-08-13: 2 mg via INTRAVENOUS

## 2022-08-13 MED ORDER — LIDOCAINE HCL (PF) 2 % IJ SOLN
INTRAMUSCULAR | Status: AC
  Filled 2022-08-13: qty 5

## 2022-08-13 MED ORDER — ACETAMINOPHEN 500 MG PO TABS
ORAL_TABLET | ORAL | Status: AC
  Filled 2022-08-13: qty 2

## 2022-08-13 MED ORDER — CELECOXIB 200 MG PO CAPS
200.0000 mg | ORAL_CAPSULE | Freq: Once | ORAL | Status: AC
  Administered 2022-08-13: 200 mg via ORAL

## 2022-08-13 MED ORDER — AMISULPRIDE (ANTIEMETIC) 5 MG/2ML IV SOLN
INTRAVENOUS | Status: AC
  Filled 2022-08-13: qty 4

## 2022-08-13 MED ORDER — OXYCODONE HCL 5 MG PO TABS
ORAL_TABLET | ORAL | Status: AC
  Filled 2022-08-13: qty 1

## 2022-08-13 MED ORDER — LIDOCAINE 2% (20 MG/ML) 5 ML SYRINGE
INTRAMUSCULAR | Status: DC | PRN
  Administered 2022-08-13: 60 mg via INTRAVENOUS

## 2022-08-13 MED ORDER — PROPOFOL 10 MG/ML IV BOLUS
INTRAVENOUS | Status: AC
  Filled 2022-08-13: qty 20

## 2022-08-13 MED ORDER — FENTANYL CITRATE (PF) 100 MCG/2ML IJ SOLN
INTRAMUSCULAR | Status: DC | PRN
  Administered 2022-08-13 (×2): 25 ug via INTRAVENOUS
  Administered 2022-08-13: 50 ug via INTRAVENOUS

## 2022-08-13 MED ORDER — OXYCODONE HCL 5 MG PO TABS
5.0000 mg | ORAL_TABLET | Freq: Once | ORAL | Status: AC | PRN
  Administered 2022-08-13: 5 mg via ORAL

## 2022-08-13 MED ORDER — FENTANYL CITRATE (PF) 100 MCG/2ML IJ SOLN
INTRAMUSCULAR | Status: AC
  Filled 2022-08-13: qty 2

## 2022-08-13 MED ORDER — POVIDONE-IODINE 10 % EX SWAB: 2.0000 | Freq: Once | CUTANEOUS | Status: DC

## 2022-08-13 MED ORDER — IBUPROFEN 800 MG PO TABS: 800.0000 mg | ORAL_TABLET | Freq: Three times a day (TID) | ORAL | 4 refills | Status: DC | PRN

## 2022-08-13 MED ORDER — DEXAMETHASONE SODIUM PHOSPHATE 10 MG/ML IJ SOLN
INTRAMUSCULAR | Status: DC | PRN
  Administered 2022-08-13: 10 mg via INTRAVENOUS

## 2022-08-13 MED ORDER — OXYCODONE HCL 5 MG/5ML PO SOLN: 5.0000 mg | Freq: Once | ORAL | Status: AC | PRN

## 2022-08-13 MED ORDER — ONDANSETRON HCL 4 MG/2ML IJ SOLN
INTRAMUSCULAR | Status: DC | PRN
  Administered 2022-08-13: 4 mg via INTRAVENOUS

## 2022-08-13 MED ORDER — LACTATED RINGERS IV SOLN: INTRAVENOUS | Status: DC

## 2022-08-13 MED ORDER — OXYCODONE-ACETAMINOPHEN 5-325 MG PO TABS: 1.0000 | ORAL_TABLET | Freq: Four times a day (QID) | ORAL | 0 refills | Status: AC | PRN

## 2022-08-13 MED ORDER — ACETAMINOPHEN 500 MG PO TABS
1000.0000 mg | ORAL_TABLET | Freq: Once | ORAL | Status: AC
  Administered 2022-08-13: 1000 mg via ORAL

## 2022-08-13 SURGICAL SUPPLY — 12 items
ABLATOR SURESOUND NOVASURE (ABLATOR) IMPLANT
GLOVE BIOGEL PI IND STRL 7.0 (GLOVE) ×2 IMPLANT
GLOVE ECLIPSE 6.5 STRL STRAW (GLOVE) ×2 IMPLANT
GOWN STRL REUS W/TWL LRG LVL3 (GOWN DISPOSABLE) ×2 IMPLANT
IV NS IRRIG 3000ML ARTHROMATIC (IV SOLUTION) ×2 IMPLANT
KIT PROCEDURE FLUENT (KITS) ×2 IMPLANT
KIT TURNOVER CYSTO (KITS) ×2 IMPLANT
PACK VAGINAL MINOR WOMEN LF (CUSTOM PROCEDURE TRAY) ×2 IMPLANT
PAD OB MATERNITY 4.3X12.25 (PERSONAL CARE ITEMS) ×2 IMPLANT
PAD PREP 24X48 CUFFED NSTRL (MISCELLANEOUS) ×2 IMPLANT
SEAL ROD LENS SCOPE MYOSURE (ABLATOR) ×4 IMPLANT
TOWEL OR 17X26 10 PK STRL BLUE (TOWEL DISPOSABLE) ×2 IMPLANT

## 2022-08-13 NOTE — Transfer of Care (Signed)
Immediate Anesthesia Transfer of Care Note  Patient: Regina Mayo  Procedure(s) Performed: Procedure(s) (LRB): DIAGNOSTIC HYSTEROSCOPY WITH NOVASURE ENDOMETRIAL ABLATION (N/A) CERVICAL POLYPECTOMY  Patient Location: PACU  Anesthesia Type: General  Level of Consciousness: awake, oriented, sedated and patient cooperative  Airway & Oxygen Therapy: Patient Spontanous Breathing and Patient connected to face mask oxygen  Post-op Assessment: Report given to PACU RN and Post -op Vital signs reviewed and stable  Post vital signs: Reviewed and stable  Complications: No apparent anesthesia complications Last Vitals:  Vitals Value Taken Time  BP 134/91 08/13/22 0901  Temp    Pulse 74 08/13/22 0903  Resp 19 08/13/22 0903  SpO2 100 % 08/13/22 0903  Vitals shown include unvalidated device data.  Last Pain:  Vitals:   08/13/22 0650  TempSrc: Oral         Complications: No notable events documented.

## 2022-08-13 NOTE — Progress Notes (Signed)
Medications times changed on printed information to match times in computer AVS.

## 2022-08-13 NOTE — H&P (Signed)
Regina Mayo is an 42 y.o. female. Asian married female presents for surgical management of menorrhagia. EBX benign path. Pt has had associated IDA due to her cycles  Pertinent Gynecological History: Menses:  menorrhagia Bleeding: menorrhagia Contraception: condoms DES exposure: unknown Blood transfusions: none Sexually transmitted diseases: no past history Previous GYN Procedures:  none   Last mammogram: normal Date: 2023 Last pap: normal Date: 2023    Menstrual History: Menarche age: n/a No LMP recorded (lmp unknown).    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 No pertinent family history.  Social History:  reports that she has never smoked. She has never used smokeless tobacco. She reports that she does not currently use alcohol. She reports that she does not use drugs.  Allergies:  Allergies  Allergen Reactions   Gluten Meal     Medications Prior to Admission  Medication Sig Dispense Refill Last Dose   Cholecalciferol (VITAMIN D3 PO)    01/31/5365   folic acid (FOLVITE) 1 MG tablet Take 1 tablet (1 mg total) by mouth daily. 30 tablet 6 Past Week   levothyroxine (SYNTHROID) 50 MCG tablet Take 50 mcg by mouth daily before breakfast.   08/12/2022   TRINTELLIX 20 MG TABS Take 20 mg by mouth daily.   08/12/2022   vitamin B-12 (CYANOCOBALAMIN) 100 MCG tablet Take 100 mcg by mouth daily.   08/10/2022   Vitamin D, Ergocalciferol, (DRISDOL) 50000 UNITS CAPS capsule Take 50,000 Units by mouth every 7 (seven) days.   Past Week   amoxicillin-clavulanate (AUGMENTIN) 875-125 MG tablet Take 1 tablet by mouth 2 (two) times daily.      cyanocobalamin (,VITAMIN B-12,) 1000 MCG/ML injection Inject into the muscle once a  week. Once a week x 4 weeks then once a month   More than a month   EPINEPHrine 0.3 mg/0.3 mL IJ SOAJ injection Inject 0.3 mLs (0.3 mg total) into the muscle as needed for anaphylaxis. (Patient not taking: Reported on 07/07/2022) 1 each 0    fluticasone (FLONASE) 50 MCG/ACT nasal spray Place 2 sprays into both nostrils daily. As needed (Patient not taking: Reported on 07/07/2022)   Unknown   loratadine-pseudoephedrine (CLARITIN-D 12-HOUR) 5-120 MG tablet Take 1 tablet by mouth 2 (two) times daily. (Patient not taking: Reported on 07/07/2022)   Unknown   predniSONE (STERAPRED UNI-PAK 21 TAB) 10 MG (21) TBPK tablet Take by mouth as directed.      traZODone (DESYREL) 50 MG tablet Take 50 mg by mouth at bedtime as needed. (Patient not taking: Reported on 07/07/2022)      VENTOLIN HFA 108 (90 Base) MCG/ACT inhaler Inhale 2 puffs into the lungs every 6 (six) hours as needed.       Review of Systems  All other systems reviewed and are negative.   Blood pressure 127/87, pulse 64, temperature 98.1 F (36.7 C), temperature source Oral, resp. rate 15, height 5\' 7"  (1.702 m), weight 81.3 kg, SpO2 99 %, unknown if currently breastfeeding. Physical Exam Constitutional:      Appearance: Normal appearance.  Eyes:     Extraocular Movements: Extraocular movements intact.  Cardiovascular:     Rate and Rhythm: Regular rhythm.     Heart  sounds: Normal heart sounds.  Pulmonary:     Breath sounds: Normal breath sounds.  Abdominal:     Palpations: Abdomen is soft.  Genitourinary:    General: Normal vulva.     Comments: Vagina nl  Cervix parous Uterus AV nl  Adnexa no palp mass Musculoskeletal:        General: Normal range of motion.     Cervical back: Neck supple.  Skin:    General: Skin is warm and dry.  Neurological:     General: No focal deficit present.     Mental Status: She is alert and oriented to person, place, and time.  Psychiatric:        Mood and Affect: Mood normal.        Behavior:  Behavior normal.     Results for orders placed or performed during the hospital encounter of 08/13/22 (from the past 24 hour(s))  Pregnancy, urine POC     Status: None   Collection Time: 08/13/22  6:45 AM  Result Value Ref Range   Preg Test, Ur NEGATIVE NEGATIVE    No results found.  Assessment/Plan: Menorrhagia P) dx hysteroscopcy, Novasure endometrial ablation. Procedure explained. Risk of surgery reviewed including infection, bleeding, injury to surrounding organ structures, thermal injury, fluid overload and its mgmt, uterine perforation( 08/998) and its risk. All ? answered  Regina Mayo [name] 08/13/2022, 7:33 AM

## 2022-08-13 NOTE — Discharge Instructions (Addendum)
CALL  IF TEMP>100.4, NOTHING PER VAGINA X 2 WK, CALL IF SOAKING A MAXI  PAD EVERY HOUR OR MORE FREQUENTLY    DISCHARGE INSTRUCTIONS: HYSTEROSCOPY / ENDOMETRIAL ABLATION The following instructions have been prepared to help you care for yourself upon your return home.  May take Ibuprofen after 1:30 pm today, if needed.  May take stool softner while taking narcotic pain medication to prevent constipation.  Drink plenty of water.  Personal hygiene:  Use sanitary pads for vaginal drainage, not tampons.  Shower the day after your procedure.  NO tub baths, pools or Jacuzzis for 2-3 weeks.  Wipe front to back after using the bathroom.  Activity and limitations:  Do NOT drive or operate any equipment for 24 hours. The effects of anesthesia are still present and drowsiness may result.  Do NOT rest in bed all day.  Walking is encouraged.  Walk up and down stairs slowly.  You may resume your normal activity in one to two days or as indicated by your physician. Sexual activity: NO intercourse for at least 2 weeks after the procedure, or as indicated by your Doctor.  Diet: Eat a light meal as desired this evening. You may resume your usual diet tomorrow.  Return to Work: You may resume your work activities in one to two days or as indicated by Marine scientist.  What to expect after your surgery: Expect to have vaginal bleeding/discharge for 2-3 days and spotting for up to 10 days. It is not unusual to have soreness for up to 1-2 weeks. You may have a slight burning sensation when you urinate for the first day. Mild cramps may continue for a couple of days. You may have a regular period in 2-6 weeks.  Call your doctor for any of the following:  Excessive vaginal bleeding or clotting, saturating and changing one pad every hour.  Inability to urinate 6 hours after discharge from hospital.  Pain not relieved by pain medication.  Fever of 100.4 F or greater.  Unusual vaginal discharge or  odor.     Post Anesthesia Home Care Instructions  Activity: Get plenty of rest for the remainder of the day. A responsible individual must stay with you for 24 hours following the procedure.  For the next 24 hours, DO NOT: -Drive a car -Paediatric nurse -Drink alcoholic beverages -Take any medication unless instructed by your physician -Make any legal decisions or sign important papers.  Meals: Start with liquid foods such as gelatin or soup. Progress to regular foods as tolerated. Avoid greasy, spicy, heavy foods. If nausea and/or vomiting occur, drink only clear liquids until the nausea and/or vomiting subsides. Call your physician if vomiting continues.  Special Instructions/Symptoms: Your throat may feel dry or sore from the anesthesia or the breathing tube placed in your throat during surgery. If this causes discomfort, gargle with warm salt water. The discomfort should disappear within 24 hours.  If you had a scopolamine patch placed behind your ear for the management of post- operative nausea and/or vomiting:  1. The medication in the patch is effective for 72 hours, after which it should be removed.  Wrap patch in a tissue and discard in the trash. Wash hands thoroughly with soap and water. 2. You may remove the patch earlier than 72 hours if you experience unpleasant side effects which may include dry mouth, dizziness or visual disturbances. 3. Avoid touching the patch. Wash your hands with soap and water after contact with the patch.  Do not  take any Tylenol or nonsteroidal anti inflammatories until  after 1:30 pm today, if needed.

## 2022-08-13 NOTE — Anesthesia Postprocedure Evaluation (Signed)
Anesthesia Post Note  Patient: Regina Mayo  Procedure(s) Performed: DIAGNOSTIC HYSTEROSCOPY WITH NOVASURE ENDOMETRIAL ABLATION CERVICAL POLYPECTOMY (Cervix)     Patient location during evaluation: PACU Anesthesia Type: General Level of consciousness: awake and alert Pain management: pain level controlled Vital Signs Assessment: post-procedure vital signs reviewed and stable Respiratory status: spontaneous breathing, nonlabored ventilation and respiratory function stable Cardiovascular status: stable and blood pressure returned to baseline Anesthetic complications: no   No notable events documented.  Last Vitals:  Vitals:   08/13/22 0930 08/13/22 0945  BP: 129/86 134/86  Pulse: 69 65  Resp: 18 18  Temp:  36.6 C  SpO2: 100% 100%                  Audry Pili

## 2022-08-13 NOTE — Op Note (Signed)
NAMEONIKA, Regina Mayo MEDICAL RECORD NO: [id number] ACCOUNT NO: [id number] DATE OF BIRTH: May 03, 1981 FACILITY: [facility] LOCATION: [facility] PHYSICIAN: Hallie Ishida A. Garwin Brothers, MD  Operative Report   DATE OF PROCEDURE: 08/13/2022  PREOPERATIVE DIAGNOSES:  Menorrhagia with regular cycles.  PROCEDURE:  diagnostic hysteroscopy, NovaSure endometrial ablation, cervical polypectomy.  POSTOPERATIVE DIAGNOSES:  Menorrhagia with regular cycles, cervical polyp.  ANESTHESIA:  General.  SURGEON:  Servando Salina, MD  ASSISTANT:  None.  DESCRIPTION OF PROCEDURE:  Under adequate general anesthesia, the patient was placed in the dorsal lithotomy position.  She was sterilely prepped and draped in the usual fashion.  The patient had voided prior to entering the room.  Examination under  anesthesia revealed a retroflexed uterus.  No adnexal masses could be appreciated.  Bivalve speculum was placed in the vagina.  A single-tooth tenaculum was placed on the anterior lip of the cervix.  A 1 cm endocervical polyp was then noted. The polyp was  grasped with a polyp forceps and twisted and removed and sent to pathology. Wide cervical ectropion noted.  The uterus was then sounded to 7 cm.  Endocervical canal sounded to 2 cm, giving a uterine length of 5 cm.  The diagnostic hysteroscope was then introduced into the uterine cavity.  Both tubal ostia were not seen.  The lower uterine segment posteriorly had some slight thickening of the endometrium, but otherwise no other lesions.  At that point, the hysteroscope was removed.  The NovaSure endometrial ablation apparatus was inserted and a width of  3.4 cm was found.  Using a power of 94 watts, endometrial ablation occurred for 1 minute exactly and the NovaSure apparatus was removed.  The diagnostic hysteroscope was reinserted.  Good ablation was noted throughout. The procedure was felt to be complete at which time, all instruments were then removed from the  vagina.  SPECIMENS:  Endocervical polyp, sent to pathology.  FLUID DEFICIT:  Fluid deficit was 65 mL.  ESTIMATED BLOOD LOSS:  1 mL.  COMPLICATIONS:  None.  DISPOSITION:  The patient tolerated the procedure well, was transferred to the recovery room in stable condition.   MUK D: 08/13/2022 8:59:28 am T: 08/13/2022 9:19:00 am  JOB: [id number]/ [id number]

## 2022-08-13 NOTE — Anesthesia Procedure Notes (Addendum)
Procedure Name: LMA Insertion Date/Time: 08/13/2022 8:22 AM  Performed by: Suan Halter, CRNAPre-anesthesia Checklist: Patient identified, Emergency Drugs available, Suction available and Patient being monitored Patient Re-evaluated:Patient Re-evaluated prior to induction Oxygen Delivery Method: Circle system utilized Preoxygenation: Pre-oxygenation with 100% oxygen Induction Type: IV induction Ventilation: Mask ventilation without difficulty LMA: LMA inserted LMA Size: 4.0 Number of attempts: 1 Airway Equipment and Method: Bite block Placement Confirmation: positive ETCO2 Tube secured with: Tape Dental Injury: Teeth and Oropharynx as per pre-operative assessment

## 2022-08-13 NOTE — Brief Op Note (Signed)
08/13/2022  8:59 AM  PATIENT:  Regina Mayo  42 y.o. female  PRE-OPERATIVE DIAGNOSIS:  menorrhagia with regular cycle; iron deficiency anemia  POST-OPERATIVE DIAGNOSIS:  menorrhagia with regular cycle; iron deficiency anemia, endocervial polyp  PROCEDURE:  Procedure(s): DIAGNOSTIC HYSTEROSCOPY WITH NOVASURE ENDOMETRIAL ABLATION (N/A) CERVICAL POLYPECTOMY  SURGEON:  Surgeon(s) and Role:    * Servando Salina, MD - Primary  PHYSICIAN ASSISTANT:   ASSISTANTS: none   ANESTHESIA:   general Findings: large endocervical polyp, sclerosed tubal ostia, thickened posterior LUS EBL:  1 ml  BLOOD ADMINISTERED:none  DRAINS: none   LOCAL MEDICATIONS USED:  NONE  SPECIMEN:  Source of Specimen:  cervical polyp  DISPOSITION OF SPECIMEN:  PATHOLOGY  COUNTS:  YES  TOURNIQUET:  * No tourniquets in log *  DICTATION: .Other Dictation: Dictation Number [id number]  PLAN OF CARE: Discharge to home after PACU  PATIENT DISPOSITION:  PACU - hemodynamically stable.   Delay start of Pharmacological VTE agent (>24hrs) due to surgical blood loss or risk of bleeding: no

## 2022-08-16 ENCOUNTER — Encounter (HOSPITAL_BASED_OUTPATIENT_CLINIC_OR_DEPARTMENT_OTHER): Payer: Self-pay | Admitting: Obstetrics and Gynecology

## 2022-08-16 LAB — SURGICAL PATHOLOGY

## 2022-08-18 DIAGNOSIS — E559 Vitamin D deficiency, unspecified: Secondary | ICD-10-CM | POA: Diagnosis not present

## 2022-08-18 DIAGNOSIS — E039 Hypothyroidism, unspecified: Secondary | ICD-10-CM | POA: Diagnosis not present

## 2022-10-05 ENCOUNTER — Inpatient Hospital Stay: Payer: BC Managed Care – PPO | Attending: Hematology & Oncology

## 2022-10-05 DIAGNOSIS — N920 Excessive and frequent menstruation with regular cycle: Secondary | ICD-10-CM | POA: Insufficient documentation

## 2022-10-05 DIAGNOSIS — E538 Deficiency of other specified B group vitamins: Secondary | ICD-10-CM | POA: Diagnosis not present

## 2022-10-05 DIAGNOSIS — K900 Celiac disease: Secondary | ICD-10-CM | POA: Diagnosis not present

## 2022-10-05 DIAGNOSIS — D508 Other iron deficiency anemias: Secondary | ICD-10-CM

## 2022-10-05 DIAGNOSIS — D500 Iron deficiency anemia secondary to blood loss (chronic): Secondary | ICD-10-CM | POA: Diagnosis not present

## 2022-10-05 LAB — CBC WITH DIFFERENTIAL (CANCER CENTER ONLY)
Abs Immature Granulocytes: 0.01 10*3/uL (ref 0.00–0.07)
Basophils Absolute: 0 10*3/uL (ref 0.0–0.1)
Basophils Relative: 1 %
Eosinophils Absolute: 0.2 10*3/uL (ref 0.0–0.5)
Eosinophils Relative: 4 %
HCT: 40.1 % (ref 36.0–46.0)
Hemoglobin: 12.8 g/dL (ref 12.0–15.0)
Immature Granulocytes: 0 %
Lymphocytes Relative: 22 %
Lymphs Abs: 1 10*3/uL (ref 0.7–4.0)
MCH: 26.8 pg (ref 26.0–34.0)
MCHC: 31.9 g/dL (ref 30.0–36.0)
MCV: 84.1 fL (ref 80.0–100.0)
Monocytes Absolute: 0.4 10*3/uL (ref 0.1–1.0)
Monocytes Relative: 10 %
Neutro Abs: 2.7 10*3/uL (ref 1.7–7.7)
Neutrophils Relative %: 63 %
Platelet Count: 167 10*3/uL (ref 150–400)
RBC: 4.77 MIL/uL (ref 3.87–5.11)
RDW: 12.9 % (ref 11.5–15.5)
WBC Count: 4.3 10*3/uL (ref 4.0–10.5)
nRBC: 0 % (ref 0.0–0.2)

## 2022-10-05 LAB — RETICULOCYTES
Immature Retic Fract: 9.8 % (ref 2.3–15.9)
RBC.: 4.71 MIL/uL (ref 3.87–5.11)
Retic Count, Absolute: 71.6 10*3/uL (ref 19.0–186.0)
Retic Ct Pct: 1.5 % (ref 0.4–3.1)

## 2022-10-05 LAB — FERRITIN: Ferritin: 37 ng/mL (ref 11–307)

## 2022-10-06 LAB — IRON AND IRON BINDING CAPACITY (CC-WL,HP ONLY)
Iron: 65 ug/dL (ref 28–170)
Saturation Ratios: 19 % (ref 10.4–31.8)
TIBC: 336 ug/dL (ref 250–450)
UIBC: 271 ug/dL (ref 148–442)

## 2022-10-18 DIAGNOSIS — E039 Hypothyroidism, unspecified: Secondary | ICD-10-CM | POA: Diagnosis not present

## 2022-10-18 DIAGNOSIS — E559 Vitamin D deficiency, unspecified: Secondary | ICD-10-CM | POA: Diagnosis not present

## 2022-12-13 DIAGNOSIS — E039 Hypothyroidism, unspecified: Secondary | ICD-10-CM | POA: Diagnosis not present

## 2022-12-13 DIAGNOSIS — K900 Celiac disease: Secondary | ICD-10-CM | POA: Diagnosis not present

## 2022-12-29 ENCOUNTER — Other Ambulatory Visit (HOSPITAL_COMMUNITY): Payer: Self-pay | Admitting: Endocrinology

## 2022-12-29 DIAGNOSIS — E559 Vitamin D deficiency, unspecified: Secondary | ICD-10-CM | POA: Diagnosis not present

## 2022-12-29 DIAGNOSIS — E0590 Thyrotoxicosis, unspecified without thyrotoxic crisis or storm: Secondary | ICD-10-CM

## 2022-12-29 DIAGNOSIS — D649 Anemia, unspecified: Secondary | ICD-10-CM | POA: Diagnosis not present

## 2022-12-29 DIAGNOSIS — K900 Celiac disease: Secondary | ICD-10-CM | POA: Diagnosis not present

## 2022-12-29 DIAGNOSIS — E039 Hypothyroidism, unspecified: Secondary | ICD-10-CM | POA: Diagnosis not present

## 2022-12-31 ENCOUNTER — Inpatient Hospital Stay: Payer: BC Managed Care – PPO | Attending: Hematology & Oncology

## 2022-12-31 ENCOUNTER — Other Ambulatory Visit: Payer: Self-pay

## 2022-12-31 ENCOUNTER — Inpatient Hospital Stay (HOSPITAL_BASED_OUTPATIENT_CLINIC_OR_DEPARTMENT_OTHER): Payer: BC Managed Care – PPO | Admitting: Family

## 2022-12-31 ENCOUNTER — Encounter: Payer: Self-pay | Admitting: Family

## 2022-12-31 VITALS — BP 138/79 | HR 68 | Temp 97.9°F | Resp 18 | Ht 67.0 in | Wt 173.4 lb

## 2022-12-31 DIAGNOSIS — K900 Celiac disease: Secondary | ICD-10-CM | POA: Insufficient documentation

## 2022-12-31 DIAGNOSIS — E538 Deficiency of other specified B group vitamins: Secondary | ICD-10-CM | POA: Diagnosis not present

## 2022-12-31 DIAGNOSIS — D500 Iron deficiency anemia secondary to blood loss (chronic): Secondary | ICD-10-CM | POA: Insufficient documentation

## 2022-12-31 DIAGNOSIS — D508 Other iron deficiency anemias: Secondary | ICD-10-CM | POA: Diagnosis not present

## 2022-12-31 DIAGNOSIS — N920 Excessive and frequent menstruation with regular cycle: Secondary | ICD-10-CM | POA: Insufficient documentation

## 2022-12-31 LAB — CBC WITH DIFFERENTIAL (CANCER CENTER ONLY)
Abs Immature Granulocytes: 0.01 10*3/uL (ref 0.00–0.07)
Basophils Absolute: 0.1 10*3/uL (ref 0.0–0.1)
Basophils Relative: 1 %
Eosinophils Absolute: 0.3 10*3/uL (ref 0.0–0.5)
Eosinophils Relative: 5 %
HCT: 41.2 % (ref 36.0–46.0)
Hemoglobin: 13.3 g/dL (ref 12.0–15.0)
Immature Granulocytes: 0 %
Lymphocytes Relative: 25 %
Lymphs Abs: 1.3 10*3/uL (ref 0.7–4.0)
MCH: 26 pg (ref 26.0–34.0)
MCHC: 32.3 g/dL (ref 30.0–36.0)
MCV: 80.5 fL (ref 80.0–100.0)
Monocytes Absolute: 0.5 10*3/uL (ref 0.1–1.0)
Monocytes Relative: 9 %
Neutro Abs: 3.3 10*3/uL (ref 1.7–7.7)
Neutrophils Relative %: 60 %
Platelet Count: 161 10*3/uL (ref 150–400)
RBC: 5.12 MIL/uL — ABNORMAL HIGH (ref 3.87–5.11)
RDW: 13.5 % (ref 11.5–15.5)
WBC Count: 5.5 10*3/uL (ref 4.0–10.5)
nRBC: 0 % (ref 0.0–0.2)

## 2022-12-31 LAB — IRON AND IRON BINDING CAPACITY (CC-WL,HP ONLY)
Iron: 90 ug/dL (ref 28–170)
Saturation Ratios: 26 % (ref 10.4–31.8)
TIBC: 342 ug/dL (ref 250–450)
UIBC: 252 ug/dL (ref 148–442)

## 2022-12-31 LAB — FERRITIN: Ferritin: 27 ng/mL (ref 11–307)

## 2022-12-31 LAB — RETICULOCYTES
Immature Retic Fract: 7.9 % (ref 2.3–15.9)
RBC.: 5.09 MIL/uL (ref 3.87–5.11)
Retic Count, Absolute: 64.1 10*3/uL (ref 19.0–186.0)
Retic Ct Pct: 1.3 % (ref 0.4–3.1)

## 2022-12-31 NOTE — Progress Notes (Signed)
Hematology and Oncology Follow Up Visit  Regina Mayo [id number] 01/07/1981 42 y.o. 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 She is doing well but does note some fatigue, weakness and SOB with exertion.  She had an endometrial ablation in January. She has not had a cycle just occasional spotting since that times.  No other blood loss noted.  No bruising or petechiae.  No fever, chills, n/v, cough, rash, dizziness, chest pain, palpitations, abdominal pain or changes in bowel or bladder habits.  No swelling, tenderness, numbness or tingling in her extremities at this time.  No falls or syncope at this time.  Appetite and hydration are good. Weight is stable at 173 lbs.   ECOG Performance Status: 1 - Symptomatic but completely ambulatory  Medications:  Allergies as of 12/31/2022       Reactions   Gluten Meal         Medication List        Accurate as of Dec 31, 2022  1:46 PM. If you have any questions, ask your nurse or doctor.          folic acid 1 MG tablet Commonly known as: FOLVITE Take 1 tablet (1 mg total) by mouth daily.   ibuprofen 800 MG tablet Commonly known as: ADVIL Take 1 tablet (800 mg total) by mouth every 8 (eight) hours as needed.   levothyroxine 50 MCG tablet Commonly known as: SYNTHROID Take 50 mcg by mouth daily before breakfast.   loratadine-pseudoephedrine 5-120 MG tablet Commonly known as: CLARITIN-D 12-hour Take 1 tablet by mouth 2 (two) times daily.   Trintellix 20 MG Tabs tablet Generic drug: vortioxetine HBr Take 20 mg by mouth daily.   vitamin B-12 100 MCG tablet Commonly known as: CYANOCOBALAMIN Take 100 mcg by  mouth daily.   cyanocobalamin 1000 MCG/ML injection Commonly known as: VITAMIN B12 Inject into the muscle once a week. Once a week x 4 weeks then once a month   Vitamin D (Ergocalciferol) 1.25 MG (50000 UNIT) Caps capsule Commonly known as: DRISDOL Take 50,000 Units by mouth every 7 (seven) days.        Allergies:  Allergies  Allergen Reactions   Gluten Meal     Past Medical History, Surgical history, Social history, and Family History were reviewed and updated.  Review of Systems: All other 10 point review of systems is negative.   Physical Exam:  vitals were not taken for this visit.   Wt Readings from Last 3 Encounters:  08/13/22 179 lb 3.2 oz (81.3 kg)  07/07/22 177 lb 1.9 oz (80.3 kg)  12/23/21 168 lb 0.6 oz (76.2 kg)    Ocular: Sclerae unicteric, pupils equal, round and reactive to light Ear-nose-throat: Oropharynx clear, dentition fair Lymphatic: No cervical or supraclavicular adenopathy Lungs no rales or rhonchi, good excursion bilaterally Heart regular rate and rhythm, no murmur appreciated Abd soft, nontender, positive bowel sounds MSK no focal spinal tenderness, no joint edema Neuro: non-focal, well-oriented, appropriate affect Breasts: Deferred   Lab Results  Component Value Date   WBC 5.5 12/31/2022   HGB 13.3 12/31/2022   HCT 41.2 12/31/2022   MCV  80.5 12/31/2022   PLT 161 12/31/2022   Lab Results  Component Value Date   FERRITIN 37 10/05/2022   IRON 65 10/05/2022   TIBC 336 10/05/2022   UIBC 271 10/05/2022   IRONPCTSAT 19 10/05/2022   Lab Results  Component Value Date   RETICCTPCT 1.3 12/31/2022   RBC 5.09 12/31/2022   RETICCTABS 58.1 08/08/2015   No results found for: "KPAFRELGTCHN", "LAMBDASER", "KAPLAMBRATIO" No results found for: "IGGSERUM", "IGA", "IGMSERUM" No results found for: "TOTALPROTELP", "ALBUMINELP", "A1GS", "A2GS", "BETS", "BETA2SER", "GAMS", "MSPIKE", "SPEI"   Chemistry      Component Value Date/Time   NA 140  11/03/2020 0911   NA 138 08/08/2015 1214   K 3.9 11/03/2020 0911   K 4.1 08/08/2015 1214   CL 107 11/03/2020 0911   CL 101 01/22/2015 1412   CO2 26 11/03/2020 0911   CO2 26 08/08/2015 1214   BUN 12 11/03/2020 0911   BUN 17.2 08/08/2015 1214   CREATININE 0.83 11/03/2020 0911   CREATININE 0.8 08/08/2015 1214      Component Value Date/Time   CALCIUM 9.0 11/03/2020 0911   CALCIUM 9.1 08/08/2015 1214   ALKPHOS 63 11/03/2020 0911   ALKPHOS 78 08/08/2015 1214   AST 12 (L) 11/03/2020 0911   AST 21 08/08/2015 1214   ALT 12 11/03/2020 0911   ALT 15 08/08/2015 1214   BILITOT 0.4 11/03/2020 0911   BILITOT <0.30 08/08/2015 1214       Impression and Plan: Regina Mayo is a very pleasant 42 yo [place] female with iron deficiency anemia secondary to menorrhagia and malabsorption with celiac disease.  Iron studies pending.  Follow-up in 6 months.   Eileen Stanford, NP 5/31/20241:46 PM

## 2023-01-03 ENCOUNTER — Encounter (HOSPITAL_COMMUNITY)
Admission: RE | Admit: 2023-01-03 | Discharge: 2023-01-03 | Disposition: A | Discharge status: Home / Self Care | Payer: BC Managed Care – PPO | Source: Ambulatory Visit | Attending: Endocrinology | Admitting: Endocrinology

## 2023-01-03 DIAGNOSIS — E0590 Thyrotoxicosis, unspecified without thyrotoxic crisis or storm: Secondary | ICD-10-CM | POA: Diagnosis not present

## 2023-01-03 MED ORDER — SODIUM IODIDE I-123 7.4 MBQ CAPS
453.0000 | ORAL_CAPSULE | Freq: Once | ORAL | Status: AC
  Administered 2023-01-03: 453 via ORAL

## 2023-01-04 ENCOUNTER — Encounter (HOSPITAL_COMMUNITY)
Admission: RE | Admit: 2023-01-04 | Discharge: 2023-01-04 | Disposition: A | Discharge status: Home / Self Care | Payer: BC Managed Care – PPO | Source: Ambulatory Visit | Attending: Endocrinology | Admitting: Endocrinology

## 2023-01-05 ENCOUNTER — Other Ambulatory Visit: Payer: BC Managed Care – PPO

## 2023-01-05 ENCOUNTER — Ambulatory Visit: Payer: BC Managed Care – PPO | Admitting: Family

## 2023-01-06 DIAGNOSIS — E0590 Thyrotoxicosis, unspecified without thyrotoxic crisis or storm: Secondary | ICD-10-CM | POA: Diagnosis not present

## 2023-01-07 ENCOUNTER — Other Ambulatory Visit (HOSPITAL_COMMUNITY): Payer: Self-pay | Admitting: Endocrinology

## 2023-01-07 DIAGNOSIS — E0590 Thyrotoxicosis, unspecified without thyrotoxic crisis or storm: Secondary | ICD-10-CM

## 2023-05-30 DIAGNOSIS — E039 Hypothyroidism, unspecified: Secondary | ICD-10-CM | POA: Diagnosis not present

## 2023-05-30 DIAGNOSIS — E559 Vitamin D deficiency, unspecified: Secondary | ICD-10-CM | POA: Diagnosis not present

## 2023-05-30 DIAGNOSIS — D649 Anemia, unspecified: Secondary | ICD-10-CM | POA: Diagnosis not present

## 2023-06-27 DIAGNOSIS — E559 Vitamin D deficiency, unspecified: Secondary | ICD-10-CM | POA: Diagnosis not present

## 2023-06-27 DIAGNOSIS — N920 Excessive and frequent menstruation with regular cycle: Secondary | ICD-10-CM | POA: Diagnosis not present

## 2023-06-27 DIAGNOSIS — D649 Anemia, unspecified: Secondary | ICD-10-CM | POA: Diagnosis not present

## 2023-06-27 DIAGNOSIS — K900 Celiac disease: Secondary | ICD-10-CM | POA: Diagnosis not present

## 2023-06-29 ENCOUNTER — Other Ambulatory Visit (HOSPITAL_COMMUNITY): Payer: Self-pay | Admitting: Endocrinology

## 2023-06-29 DIAGNOSIS — E0500 Thyrotoxicosis with diffuse goiter without thyrotoxic crisis or storm: Secondary | ICD-10-CM

## 2023-06-29 NOTE — Written Directive (Addendum)
MOLECULAR IMAGING AND THERAPEUTICS WRITTEN DIRECTIVE   PATIENT NAME: Regina Mayo  PT DOB:   May 04, 1981                                              MRN: [id number]  ---------------------------------------------------------------------------------------------------------------------   I-131 WHOLE THYROID THERAPY (NON-CANCER)    RADIOPHARMACEUTICAL:   Iodine-131 Capsule    PRESCRIBED DOSE FOR ADMINISTRATION: 14 mCi   ROUTE OFADMINISTRATION: PO   DIAGNOSIS: Graves Disease    REFERRING PHYSICIAN: Dorisann Frames, MD   TSH:  0.014 on 05/30/2023   PRIOR I-131 THERAPY (Date and Dose):   PRIOR RADIOLOGY EXAMS (Results and Date): NM THYROID MULT UPTAKE W/IMAGING  Result Date: 01/06/2023 CLINICAL DATA:  Evaluate thyroid uptake.  Hyperthyroidism. EXAM: THYROID SCAN AND UPTAKE - 4 AND 24 HOURS TECHNIQUE: Following oral administration of I-123 capsule, anterior planar imaging was acquired at 24 hours. Thyroid uptake was calculated with a thyroid probe at 4-6 hours and 24 hours. RADIOPHARMACEUTICALS:  453.0 uCi I-123 sodium iodide p.o. COMPARISON:  None Available. FINDINGS: Homogeneous uptake is identified throughout both thyroid lobes. 4 hour I-123 uptake = 43.8% (normal 5-20%) 24 hour I-123 uptake = 63.8% (normal 10-30%) IMPRESSION: Elevated iodine uptake in the thyroid. Homogeneous uptake of I Dyna in the thyroid. The constellation of findings is most consistent with Graves disease. Electronically Signed   By: Gerome Sam III M.D.   On: 01/06/2023 12:55      ADDITIONAL PHYSICIAN COMMENTS/NOTES Graves disease by imaging and uptake.  Depressed TSH  AUTHORIZED USER SIGNATURE & TIME STAMP: Patriciaann Clan, MD   06/29/23    1:11 PM

## 2023-06-30 ENCOUNTER — Encounter: Payer: Self-pay | Admitting: Family

## 2023-07-05 ENCOUNTER — Encounter: Payer: Self-pay | Admitting: Family

## 2023-07-07 ENCOUNTER — Other Ambulatory Visit (HOSPITAL_COMMUNITY)
Admission: RE | Admit: 2023-07-07 | Discharge: 2023-07-07 | Disposition: A | Discharge status: Home / Self Care | Payer: BC Managed Care – PPO | Source: Ambulatory Visit | Attending: Endocrinology | Admitting: Endocrinology

## 2023-07-07 ENCOUNTER — Ambulatory Visit (HOSPITAL_COMMUNITY)
Admission: RE | Admit: 2023-07-07 | Discharge: 2023-07-07 | Disposition: A | Discharge status: Home / Self Care | Payer: BC Managed Care – PPO | Source: Ambulatory Visit | Attending: Endocrinology | Admitting: Endocrinology

## 2023-07-07 DIAGNOSIS — E0500 Thyrotoxicosis with diffuse goiter without thyrotoxic crisis or storm: Secondary | ICD-10-CM | POA: Insufficient documentation

## 2023-07-07 LAB — HCG, SERUM, QUALITATIVE: Preg, Serum: NEGATIVE

## 2023-07-07 MED ORDER — SODIUM IODIDE I 131 CAPSULE
14.0000 | Freq: Once | INTRAVENOUS | Status: AC | PRN
  Administered 2023-07-07: 14 via ORAL

## 2023-08-10 ENCOUNTER — Inpatient Hospital Stay: Payer: BC Managed Care – PPO | Attending: Hematology & Oncology

## 2023-08-10 ENCOUNTER — Inpatient Hospital Stay: Payer: BC Managed Care – PPO | Admitting: Family

## 2023-08-10 VITALS — BP 133/86 | HR 80 | Temp 97.6°F | Resp 16 | Wt 173.4 lb

## 2023-08-10 DIAGNOSIS — E538 Deficiency of other specified B group vitamins: Secondary | ICD-10-CM | POA: Insufficient documentation

## 2023-08-10 DIAGNOSIS — D508 Other iron deficiency anemias: Secondary | ICD-10-CM | POA: Diagnosis not present

## 2023-08-10 DIAGNOSIS — K909 Intestinal malabsorption, unspecified: Secondary | ICD-10-CM | POA: Diagnosis not present

## 2023-08-10 DIAGNOSIS — N921 Excessive and frequent menstruation with irregular cycle: Secondary | ICD-10-CM | POA: Diagnosis not present

## 2023-08-10 DIAGNOSIS — D500 Iron deficiency anemia secondary to blood loss (chronic): Secondary | ICD-10-CM | POA: Insufficient documentation

## 2023-08-10 DIAGNOSIS — N920 Excessive and frequent menstruation with regular cycle: Secondary | ICD-10-CM | POA: Diagnosis not present

## 2023-08-10 LAB — CBC WITH DIFFERENTIAL (CANCER CENTER ONLY)
Abs Immature Granulocytes: 0.07 10*3/uL (ref 0.00–0.07)
Basophils Absolute: 0.1 10*3/uL (ref 0.0–0.1)
Basophils Relative: 1 %
Eosinophils Absolute: 0.2 10*3/uL (ref 0.0–0.5)
Eosinophils Relative: 4 %
HCT: 40.4 % (ref 36.0–46.0)
Hemoglobin: 13.3 g/dL (ref 12.0–15.0)
Immature Granulocytes: 1 %
Lymphocytes Relative: 20 %
Lymphs Abs: 1.2 10*3/uL (ref 0.7–4.0)
MCH: 27.1 pg (ref 26.0–34.0)
MCHC: 32.9 g/dL (ref 30.0–36.0)
MCV: 82.4 fL (ref 80.0–100.0)
Monocytes Absolute: 0.6 10*3/uL (ref 0.1–1.0)
Monocytes Relative: 9 %
Neutro Abs: 4 10*3/uL (ref 1.7–7.7)
Neutrophils Relative %: 65 %
Platelet Count: 168 10*3/uL (ref 150–400)
RBC: 4.9 MIL/uL (ref 3.87–5.11)
RDW: 12.7 % (ref 11.5–15.5)
WBC Count: 6.1 10*3/uL (ref 4.0–10.5)
nRBC: 0 % (ref 0.0–0.2)

## 2023-08-10 LAB — IRON AND IRON BINDING CAPACITY (CC-WL,HP ONLY)
Iron: 82 ug/dL (ref 28–170)
Saturation Ratios: 21 % (ref 10.4–31.8)
TIBC: 386 ug/dL (ref 250–450)
UIBC: 304 ug/dL (ref 148–442)

## 2023-08-10 LAB — RETICULOCYTES
Immature Retic Fract: 7.6 % (ref 2.3–15.9)
RBC.: 4.94 MIL/uL (ref 3.87–5.11)
Retic Count, Absolute: 61.3 10*3/uL (ref 19.0–186.0)
Retic Ct Pct: 1.2 % (ref 0.4–3.1)

## 2023-08-10 LAB — FERRITIN: Ferritin: 18 ng/mL (ref 11–307)

## 2023-08-10 NOTE — Progress Notes (Signed)
 Hematology and Oncology Follow Up Visit  Regina Mayo [id number] 03-01-1981 42 y.o. 08/10/2023   Principle Diagnosis:  Iron  deficiency anemia secondary to heavy cycles and malabsorption with celiac disease B 12 deficiency    Current Therapy:        IV iron  as indicated  B 12 injections monthly - gives self and monitored by endocrinologist  Folic acid  1 mg PO daily    Interim History:  Regina Mayo is here today for follow-up. She is doing quite well but still notes th e same SOB with over exertion at times. This resolves with taking a break to rest.  She states that she was diagnosed with Grave's disease since out last visit and treated with radioactive iodine  therapy. She will discuss medication with her endocrinologist at their next visit.  No cycle since ablation 1 year ago.  No blood loss, bruising or petechiae.  No fever, chills, n/v, cough, rash, dizziness, chest pain, palpitations, abdominal pain or changes in bowel or bladder habits at this time.  No falls or syncope.  Appetite and hydration are good. Weight is stable at 173 lbs.   ECOG Performance Status: 1 - Symptomatic but completely ambulatory  Medications:  Allergies as of 08/10/2023       Reactions   Gluten Meal Other (See Comments)        Medication List        Accurate as of August 10, 2023 12:57 PM. If you have any questions, ask your nurse or doctor.          loratadine-pseudoephedrine 5-120 MG tablet Commonly known as: CLARITIN-D 12-hour Take 1 tablet by mouth 2 (two) times daily.   Trintellix 20 MG Tabs tablet Generic drug: vortioxetine HBr Take 20 mg by mouth daily.   vitamin B-12 100 MCG tablet Commonly known as: CYANOCOBALAMIN  Take 100 mcg by mouth daily.   cyanocobalamin  1000 MCG/ML injection Commonly known as: VITAMIN B12 Inject into the muscle once a week. Once a week x 4 weeks then once a month   Vitamin D  (Ergocalciferol ) 1.25 MG (50000 UNIT) Caps capsule Commonly known  as: DRISDOL Take 50,000 Units by mouth every 7 (seven) days.        Allergies:  Allergies  Allergen Reactions   Gluten Meal Other (See Comments)    Past Medical History, Surgical history, Social history, and Family History were reviewed and updated.  Review of Systems: All other 10 point review of systems is negative.   Physical Exam:  weight is 173 lb 6.4 oz (78.7 kg). Her oral temperature is 97.6 F (36.4 C). Her blood pressure is 133/86 and her pulse is 80. Her respiration is 16 and oxygen saturation is 100%.   Wt Readings from Last 3 Encounters:  08/10/23 173 lb 6.4 oz (78.7 kg)  12/31/22 173 lb 6.4 oz (78.7 kg)  08/13/22 179 lb 3.2 oz (81.3 kg)    Ocular: Sclerae unicteric, pupils equal, round and reactive to light Ear-nose-throat: Oropharynx clear, dentition fair Lymphatic: No cervical or supraclavicular adenopathy Lungs no rales or rhonchi, good excursion bilaterally Heart regular rate and rhythm, no murmur appreciated Abd soft, nontender, positive bowel sounds MSK no focal spinal tenderness, no joint edema Neuro: non-focal, well-oriented, appropriate affect Breasts: Deferred   Lab Results  Component Value Date   WBC 6.1 08/10/2023   HGB 13.3 08/10/2023   HCT 40.4 08/10/2023   MCV 82.4 08/10/2023   PLT 168 08/10/2023   Lab Results  Component Value Date   FERRITIN  27 12/31/2022   IRON  90 12/31/2022   TIBC 342 12/31/2022   UIBC 252 12/31/2022   IRONPCTSAT 26 12/31/2022   Lab Results  Component Value Date   RETICCTPCT 1.2 08/10/2023   RBC 4.94 08/10/2023   RBC 4.90 08/10/2023   RETICCTABS 58.1 08/08/2015   No results found for: KPAFRELGTCHN, LAMBDASER, KAPLAMBRATIO No results found for: IGGSERUM, IGA, IGMSERUM No results found for: STEPHANY CARLOTA BENSON MARKEL EARLA JOANNIE DOC VICK, SPEI   Chemistry      Component Value Date/Time   NA 140 11/03/2020 0911   NA 138 08/08/2015 1214   K 3.9 11/03/2020  0911   K 4.1 08/08/2015 1214   CL 107 11/03/2020 0911   CL 101 01/22/2015 1412   CO2 26 11/03/2020 0911   CO2 26 08/08/2015 1214   BUN 12 11/03/2020 0911   BUN 17.2 08/08/2015 1214   CREATININE 0.83 11/03/2020 0911   CREATININE 0.8 08/08/2015 1214      Component Value Date/Time   CALCIUM 9.0 11/03/2020 0911   CALCIUM 9.1 08/08/2015 1214   ALKPHOS 63 11/03/2020 0911   ALKPHOS 78 08/08/2015 1214   AST 12 (L) 11/03/2020 0911   AST 21 08/08/2015 1214   ALT 12 11/03/2020 0911   ALT 15 08/08/2015 1214   BILITOT 0.4 11/03/2020 0911   BILITOT <0.30 08/08/2015 1214       Impression and Plan: Regina Mayo is a very pleasant 43 yo [place] female with iron  deficiency anemia secondary to menorrhagia and malabsorption with celiac disease.  Iron  studies pending.  Follow-up in 1 year. If all is well at that time we will release her from our practice.   Lauraine Pepper, NP 1/8/202512:57 PM

## 2023-08-22 DIAGNOSIS — E559 Vitamin D deficiency, unspecified: Secondary | ICD-10-CM | POA: Diagnosis not present

## 2023-09-12 DIAGNOSIS — M21611 Bunion of right foot: Secondary | ICD-10-CM | POA: Diagnosis not present

## 2023-09-12 DIAGNOSIS — M76821 Posterior tibial tendinitis, right leg: Secondary | ICD-10-CM | POA: Diagnosis not present

## 2023-09-12 DIAGNOSIS — M24571 Contracture, right ankle: Secondary | ICD-10-CM | POA: Diagnosis not present

## 2023-09-29 DIAGNOSIS — E0500 Thyrotoxicosis with diffuse goiter without thyrotoxic crisis or storm: Secondary | ICD-10-CM | POA: Diagnosis not present

## 2023-09-29 DIAGNOSIS — N920 Excessive and frequent menstruation with regular cycle: Secondary | ICD-10-CM | POA: Diagnosis not present

## 2023-09-29 DIAGNOSIS — E559 Vitamin D deficiency, unspecified: Secondary | ICD-10-CM | POA: Diagnosis not present

## 2023-09-29 DIAGNOSIS — K900 Celiac disease: Secondary | ICD-10-CM | POA: Diagnosis not present

## 2023-10-11 DIAGNOSIS — M76821 Posterior tibial tendinitis, right leg: Secondary | ICD-10-CM | POA: Diagnosis not present

## 2023-10-18 DIAGNOSIS — Z6826 Body mass index (BMI) 26.0-26.9, adult: Secondary | ICD-10-CM | POA: Diagnosis not present

## 2023-10-18 DIAGNOSIS — Z0000 Encounter for general adult medical examination without abnormal findings: Secondary | ICD-10-CM | POA: Diagnosis not present

## 2023-11-22 DIAGNOSIS — Q666 Other congenital valgus deformities of feet: Secondary | ICD-10-CM | POA: Diagnosis not present

## 2023-12-21 DIAGNOSIS — E0500 Thyrotoxicosis with diffuse goiter without thyrotoxic crisis or storm: Secondary | ICD-10-CM | POA: Diagnosis not present

## 2024-02-21 DIAGNOSIS — E0500 Thyrotoxicosis with diffuse goiter without thyrotoxic crisis or storm: Secondary | ICD-10-CM | POA: Diagnosis not present

## 2024-04-04 DIAGNOSIS — E039 Hypothyroidism, unspecified: Secondary | ICD-10-CM | POA: Diagnosis not present

## 2024-04-05 DIAGNOSIS — D649 Anemia, unspecified: Secondary | ICD-10-CM | POA: Diagnosis not present

## 2024-04-05 DIAGNOSIS — K900 Celiac disease: Secondary | ICD-10-CM | POA: Diagnosis not present

## 2024-04-05 DIAGNOSIS — E559 Vitamin D deficiency, unspecified: Secondary | ICD-10-CM | POA: Diagnosis not present

## 2024-04-05 DIAGNOSIS — N920 Excessive and frequent menstruation with regular cycle: Secondary | ICD-10-CM | POA: Diagnosis not present

## 2024-08-08 ENCOUNTER — Inpatient Hospital Stay: Payer: BC Managed Care – PPO

## 2024-08-08 ENCOUNTER — Inpatient Hospital Stay: Payer: BC Managed Care – PPO | Admitting: Family
